# Patient Record
Sex: Male | Born: 1985 | Race: Black or African American | Hispanic: No | Marital: Single | Smoking: Current every day smoker
Health system: Southern US, Community
[De-identification: ages and names within clinical notes are randomized; demographics above are authoritative.]

## PROBLEM LIST (undated history)

## (undated) DIAGNOSIS — R109 Unspecified abdominal pain: Secondary | ICD-10-CM

## (undated) DIAGNOSIS — T560X1A Toxic effect of lead and its compounds, accidental (unintentional), initial encounter: Secondary | ICD-10-CM

## (undated) DIAGNOSIS — R079 Chest pain, unspecified: Secondary | ICD-10-CM

## (undated) DIAGNOSIS — I10 Essential (primary) hypertension: Secondary | ICD-10-CM

## (undated) HISTORY — PX: FOOT SURGERY: SHX648

## (undated) HISTORY — DX: Chest pain, unspecified: R07.9

---
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
## 2009-01-20 ENCOUNTER — Emergency Department (HOSPITAL_COMMUNITY): Admission: EM | Admit: 2009-01-20 | Discharge: 2009-01-20 | Payer: Self-pay | Admitting: Emergency Medicine

## 2010-02-21 ENCOUNTER — Emergency Department (HOSPITAL_COMMUNITY)
Admission: EM | Admit: 2010-02-21 | Discharge: 2010-02-21 | Disposition: A | Discharge status: Home / Self Care | Payer: Medicaid Other | Attending: Emergency Medicine | Admitting: Emergency Medicine

## 2010-02-21 DIAGNOSIS — Y929 Unspecified place or not applicable: Secondary | ICD-10-CM | POA: Insufficient documentation

## 2010-02-21 DIAGNOSIS — M79609 Pain in unspecified limb: Secondary | ICD-10-CM | POA: Insufficient documentation

## 2010-02-21 DIAGNOSIS — T8132XA Disruption of internal operation (surgical) wound, not elsewhere classified, initial encounter: Secondary | ICD-10-CM | POA: Insufficient documentation

## 2010-02-21 DIAGNOSIS — T81329A Deep disruption or dehiscence of operation wound, unspecified, initial encounter: Secondary | ICD-10-CM | POA: Insufficient documentation

## 2010-02-21 DIAGNOSIS — Y838 Other surgical procedures as the cause of abnormal reaction of the patient, or of later complication, without mention of misadventure at the time of the procedure: Secondary | ICD-10-CM | POA: Insufficient documentation

## 2010-04-01 LAB — URINE MICROSCOPIC-ADD ON

## 2010-04-01 LAB — URINALYSIS, ROUTINE W REFLEX MICROSCOPIC
Glucose, UA: NEGATIVE mg/dL
Nitrite: NEGATIVE
Specific Gravity, Urine: 1.024 (ref 1.005–1.030)
pH: 7 (ref 5.0–8.0)

## 2010-04-01 LAB — DIFFERENTIAL
Eosinophils Absolute: 0.3 10*3/uL (ref 0.0–0.7)
Lymphocytes Relative: 38 % (ref 12–46)
Lymphs Abs: 4.7 10*3/uL — ABNORMAL HIGH (ref 0.7–4.0)
Monocytes Relative: 6 % (ref 3–12)
Neutro Abs: 6.5 10*3/uL (ref 1.7–7.7)
Neutrophils Relative %: 53 % (ref 43–77)

## 2010-04-01 LAB — CBC
Hemoglobin: 14.3 g/dL (ref 13.0–17.0)
MCHC: 32.3 g/dL (ref 30.0–36.0)
MCV: 67.2 fL — ABNORMAL LOW (ref 78.0–100.0)
RDW: 16.5 % — ABNORMAL HIGH (ref 11.5–15.5)

## 2010-04-01 LAB — LIPASE, BLOOD: Lipase: 36 U/L (ref 11–59)

## 2010-04-01 LAB — COMPREHENSIVE METABOLIC PANEL
ALT: 34 U/L (ref 0–53)
Calcium: 9.2 mg/dL (ref 8.4–10.5)
Creatinine, Ser: 0.84 mg/dL (ref 0.4–1.5)
Glucose, Bld: 94 mg/dL (ref 70–99)
Sodium: 137 mEq/L (ref 135–145)
Total Protein: 6.2 g/dL (ref 6.0–8.3)

## 2010-06-01 NOTE — Consult Note (Signed)
NAMEAKSH, SWART                ACCOUNT NO.:  [id number]   MEDICAL RECORD NO.:  [id number]         PATIENT TYPE:  LEMS   LOCATION:                               FACILITY:  [facility]   PHYSICIAN:  Karol T. Lazarus Salines, M.D.      DATE OF BIRTH:   DATE OF CONSULTATION:  08/05/2005  DATE OF DISCHARGE:                                   CONSULTATION   CHIEF COMPLAINT:  Severe right sore throat.   HISTORY:  A 25 year old black male came down with a mild sore throat Monday  6 days ago which has gotten progressively worse; and over the past couple of  days has localized to the right side of his throat.  He complained about  right ear pain; and was told by the [facility] that he had an ear infection.  He was given ear drops which did nothing for the pain.  He has had a sore  throat with painful difficult swallowing.  He really has not had nothing  much to eat in 3 or 4 days and not much to drink in the past 24-48 hours.  He has tenderness in his right upper neck.  No documented fever.  He is not  diabetic or otherwise immune compromised.  No past history of tonsillitis,  strep throat, or peritonsillar abscess.  He smokes 1/2-pack-per-day.   PHYSICAL EXAMINATION:  GENERAL:  This is a healthy-appearing, but somewhat  apprehensive adolescent black male.  He has a mild hot potato voice and a  slight trismus.  He is not warm to touch.  MENTAL STATUS:  Basically intact.  FOCUSED EXAM:  He hears well in conversational speech.  Voice is phonatory  and respirations unlabored through the nose.  The head is atraumatic and  neck supple.  Cranial nerves intact.  The ears are clear with normal aerated  drums.  Anterior nose not examined.  Oral cavity is moist with teeth in good  repair.  He had erythematous, indurated, tender swelling of the superior  peritonsillar region and soft palate with deviation of the uvula towards the  left.  There was some apparent spontaneous drainage from the superior pole  of the  right tonsil.  There is some foul smelling odor consistent with  anaerobic infection.  The left tonsil is 2+ and not inflamed.  Did not  examine the nasopharynx or hypopharynx.  Neck is slightly tender in the  right jugulodigastric region without discrete adenopathy.   IMPRESSION:  Right peritonsillar abscess.   PLAN:  I discussed this with the patient and his mother.  He needs incision  and drainage either here or in the operating room.  We can do this with  conscious sedation technique in the emergency room at his election.   DESCRIPTION OF PROCEDURE:  With informed consent, having received 4 mg of IV  morphine within the last 20 minutes, he received 2 mg of IV Versed followed  by another 2 mg when the first dose did not have much of an effect.  The  soft palate was anesthetized with Hurricaine spray x2 followed  by roughly 10  mL of 1% Xylocaine with 1:200,000 epinephrine infiltrated with a 25-gauge  spinal needle in stages.  This did seem to be taking adequate effect.  After  allowing time for it take effect, with good compliance on the part of the  patient, a 2-cm crescent incision was made overlying the superior pole of  the right tonsil; and carried down sharply approximately 1.5 cm.  No abscess  cavity was encountered.  Slightly deeper to the superior pole a small  trickle of pus was identified.  The opening was enlarged with the tonsil  hemostat with mild discomfort.  A large foul-smelling anaerobic abscess,  approximately 10 mL total was evacuated from the posterior-superior pole of  the tonsil.  The patient overall tolerated this well.  Hemostasis was  spontaneous.   Will complete the recovery from the conscious sedation technique.  I gave  him a prescription for amoxicillin liquid, 1 gram t.i.d. for 3 days followed  by 500 mg capsules t.i.d. for 7 additional days.  Also a prescription for  Tylenol with Codeine liquid for pain relief.  I cautioned mother that the  abscess  cavity could possibly attempt to close up, in which case he might  not be making steady progress over the next 1-3 days.  If he is doing well  over that time, he will probably resolve completely; and we will see him  back in 2 weeks.  Absent any past history of significant tonsil problems, he  should not necessarily need a tonsillectomy.  Family understands and agrees  with the discussion and plans.      Gloris Manchester. Lazarus Salines, M.D.  Electronically Signed     KTW/MEDQ  D:  08/05/2005  T:  08/05/2005  Job:  [id number]

## 2011-10-02 ENCOUNTER — Encounter (HOSPITAL_COMMUNITY): Payer: Self-pay | Admitting: *Deleted

## 2011-10-02 ENCOUNTER — Emergency Department (HOSPITAL_COMMUNITY)
Admission: EM | Admit: 2011-10-02 | Discharge: 2011-10-02 | Disposition: A | Discharge status: Home / Self Care | Payer: Medicaid Other | Attending: Emergency Medicine | Admitting: Emergency Medicine

## 2011-10-02 DIAGNOSIS — R197 Diarrhea, unspecified: Secondary | ICD-10-CM | POA: Insufficient documentation

## 2011-10-02 DIAGNOSIS — S1093XA Contusion of unspecified part of neck, initial encounter: Secondary | ICD-10-CM | POA: Insufficient documentation

## 2011-10-02 DIAGNOSIS — B49 Unspecified mycosis: Secondary | ICD-10-CM | POA: Insufficient documentation

## 2011-10-02 DIAGNOSIS — Z23 Encounter for immunization: Secondary | ICD-10-CM | POA: Insufficient documentation

## 2011-10-02 DIAGNOSIS — Z79899 Other long term (current) drug therapy: Secondary | ICD-10-CM | POA: Insufficient documentation

## 2011-10-02 DIAGNOSIS — W503XXA Accidental bite by another person, initial encounter: Secondary | ICD-10-CM

## 2011-10-02 DIAGNOSIS — R109 Unspecified abdominal pain: Secondary | ICD-10-CM | POA: Insufficient documentation

## 2011-10-02 DIAGNOSIS — M545 Low back pain, unspecified: Secondary | ICD-10-CM | POA: Insufficient documentation

## 2011-10-02 DIAGNOSIS — L84 Corns and callosities: Secondary | ICD-10-CM | POA: Insufficient documentation

## 2011-10-02 DIAGNOSIS — R110 Nausea: Secondary | ICD-10-CM

## 2011-10-02 DIAGNOSIS — R112 Nausea with vomiting, unspecified: Secondary | ICD-10-CM | POA: Insufficient documentation

## 2011-10-02 DIAGNOSIS — S0003XA Contusion of scalp, initial encounter: Secondary | ICD-10-CM | POA: Insufficient documentation

## 2011-10-02 LAB — CBC WITH DIFFERENTIAL/PLATELET
Basophils Absolute: 0.1 10*3/uL (ref 0.0–0.1)
Eosinophils Absolute: 0.2 10*3/uL (ref 0.0–0.7)
Lymphocytes Relative: 39 % (ref 12–46)
Lymphs Abs: 3.4 10*3/uL (ref 0.7–4.0)
MCHC: 32.6 g/dL (ref 30.0–36.0)
Monocytes Relative: 4 % (ref 3–12)
Neutro Abs: 4.6 10*3/uL (ref 1.7–7.7)
Platelets: 259 10*3/uL (ref 150–400)
RDW: 16.5 % — ABNORMAL HIGH (ref 11.5–15.5)
WBC: 8.6 10*3/uL (ref 4.0–10.5)

## 2011-10-02 LAB — URINALYSIS, ROUTINE W REFLEX MICROSCOPIC
Glucose, UA: NEGATIVE mg/dL
Hgb urine dipstick: NEGATIVE
Leukocytes, UA: NEGATIVE
pH: 6.5 (ref 5.0–8.0)

## 2011-10-02 LAB — COMPREHENSIVE METABOLIC PANEL
AST: 23 U/L (ref 0–37)
Albumin: 4 g/dL (ref 3.5–5.2)
BUN: 11 mg/dL (ref 6–23)
Chloride: 103 mEq/L (ref 96–112)
Creatinine, Ser: 0.93 mg/dL (ref 0.50–1.35)
Potassium: 4.4 mEq/L (ref 3.5–5.1)
Total Bilirubin: 0.2 mg/dL — ABNORMAL LOW (ref 0.3–1.2)
Total Protein: 6.9 g/dL (ref 6.0–8.3)

## 2011-10-02 MED ORDER — DIPHENOXYLATE-ATROPINE 2.5-0.025 MG PO TABS: 1.0000 | ORAL_TABLET | Freq: Four times a day (QID) | ORAL | Status: DC | PRN

## 2011-10-02 MED ORDER — ONDANSETRON HCL 4 MG PO TABS: 4.0000 mg | ORAL_TABLET | Freq: Four times a day (QID) | ORAL | Status: DC

## 2011-10-02 MED ORDER — ONDANSETRON HCL 4 MG/2ML IJ SOLN
4.0000 mg | Freq: Once | INTRAMUSCULAR | Status: AC
  Administered 2011-10-02: 4 mg via INTRAVENOUS
  Filled 2011-10-02: qty 2

## 2011-10-02 MED ORDER — TETANUS-DIPHTH-ACELL PERTUSSIS 5-2.5-18.5 LF-MCG/0.5 IM SUSP
0.5000 mL | Freq: Once | INTRAMUSCULAR | Status: AC
  Administered 2011-10-02: 0.5 mL via INTRAMUSCULAR
  Filled 2011-10-02: qty 0.5

## 2011-10-02 MED ORDER — SODIUM CHLORIDE 0.9 % IV BOLUS (SEPSIS)
1000.0000 mL | Freq: Once | INTRAVENOUS | Status: AC
  Administered 2011-10-02: 1000 mL via INTRAVENOUS

## 2011-10-02 MED ORDER — AMOXICILLIN-POT CLAVULANATE 500-125 MG PO TABS: 1.0000 | ORAL_TABLET | Freq: Two times a day (BID) | ORAL | Status: DC
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
## 2011-10-03 NOTE — ED Provider Notes (Signed)
Medical screening examination/treatment/procedure(s) were performed by non-physician practitioner and as supervising physician I was immediately available for consultation/collaboration.   Anaysha Andre, MD 10/03/11 0722 

## 2011-10-04 LAB — URINE CULTURE: Colony Count: 3000

## 2012-07-31 ENCOUNTER — Other Ambulatory Visit (HOSPITAL_COMMUNITY): Payer: Self-pay | Admitting: Podiatry

## 2012-08-03 ENCOUNTER — Other Ambulatory Visit (HOSPITAL_COMMUNITY): Payer: Self-pay | Admitting: Podiatry

## 2012-08-03 DIAGNOSIS — M79671 Pain in right foot: Secondary | ICD-10-CM

## 2012-08-07 ENCOUNTER — Encounter (HOSPITAL_COMMUNITY): Payer: Medicaid Other

## 2012-08-10 ENCOUNTER — Other Ambulatory Visit (HOSPITAL_COMMUNITY): Payer: Medicaid Other

## 2012-08-10 ENCOUNTER — Encounter (HOSPITAL_COMMUNITY): Payer: Medicaid Other

## 2012-08-13 ENCOUNTER — Ambulatory Visit (HOSPITAL_COMMUNITY): Admission: RE | Admit: 2012-08-13 | Payer: Medicaid Other | Source: Ambulatory Visit

## 2012-08-13 ENCOUNTER — Ambulatory Visit (HOSPITAL_COMMUNITY): Payer: Medicaid Other

## 2012-09-02 ENCOUNTER — Ambulatory Visit (HOSPITAL_COMMUNITY): Payer: Medicaid Other | Attending: Podiatry

## 2012-09-02 ENCOUNTER — Encounter (HOSPITAL_COMMUNITY): Admission: RE | Admit: 2012-09-02 | Payer: Medicaid Other | Source: Ambulatory Visit

## 2013-11-09 ENCOUNTER — Encounter (HOSPITAL_COMMUNITY): Payer: Self-pay | Admitting: Emergency Medicine

## 2013-11-09 ENCOUNTER — Emergency Department (HOSPITAL_COMMUNITY): Payer: Medicaid Other

## 2013-11-09 ENCOUNTER — Emergency Department (HOSPITAL_COMMUNITY)
Admission: EM | Admit: 2013-11-09 | Discharge: 2013-11-09 | Disposition: A | Discharge status: Home / Self Care | Payer: Medicaid Other | Attending: Emergency Medicine | Admitting: Emergency Medicine

## 2013-11-09 DIAGNOSIS — R1032 Left lower quadrant pain: Secondary | ICD-10-CM | POA: Insufficient documentation

## 2013-11-09 DIAGNOSIS — K5900 Constipation, unspecified: Secondary | ICD-10-CM | POA: Diagnosis not present

## 2013-11-09 DIAGNOSIS — R1033 Periumbilical pain: Secondary | ICD-10-CM | POA: Insufficient documentation

## 2013-11-09 DIAGNOSIS — Z720 Tobacco use: Secondary | ICD-10-CM | POA: Diagnosis not present

## 2013-11-09 DIAGNOSIS — R109 Unspecified abdominal pain: Secondary | ICD-10-CM

## 2013-11-09 LAB — COMPREHENSIVE METABOLIC PANEL
ALK PHOS: 68 U/L (ref 39–117)
ALT: 18 U/L (ref 0–53)
AST: 22 U/L (ref 0–37)
Albumin: 4.2 g/dL (ref 3.5–5.2)
Anion gap: 11 (ref 5–15)
BUN: 12 mg/dL (ref 6–23)
CO2: 27 meq/L (ref 19–32)
Calcium: 9.2 mg/dL (ref 8.4–10.5)
Chloride: 103 mEq/L (ref 96–112)
Creatinine, Ser: 0.98 mg/dL (ref 0.50–1.35)
GLUCOSE: 86 mg/dL (ref 70–99)
POTASSIUM: 4.1 meq/L (ref 3.7–5.3)
SODIUM: 141 meq/L (ref 137–147)
TOTAL PROTEIN: 7.4 g/dL (ref 6.0–8.3)
Total Bilirubin: 0.4 mg/dL (ref 0.3–1.2)

## 2013-11-09 LAB — CBC WITH DIFFERENTIAL/PLATELET
BASOS PCT: 0 % (ref 0–1)
Basophils Absolute: 0 10*3/uL (ref 0.0–0.1)
EOS PCT: 1 % (ref 0–5)
Eosinophils Absolute: 0.1 10*3/uL (ref 0.0–0.7)
HCT: 40.4 % (ref 39.0–52.0)
HEMOGLOBIN: 13.3 g/dL (ref 13.0–17.0)
LYMPHS PCT: 31 % (ref 12–46)
Lymphs Abs: 3.1 10*3/uL (ref 0.7–4.0)
MCH: 21.8 pg — ABNORMAL LOW (ref 26.0–34.0)
MCHC: 32.9 g/dL (ref 30.0–36.0)
MCV: 66.3 fL — ABNORMAL LOW (ref 78.0–100.0)
Monocytes Absolute: 0.5 10*3/uL (ref 0.1–1.0)
Monocytes Relative: 5 % (ref 3–12)
NEUTROS PCT: 63 % (ref 43–77)
Neutro Abs: 6.2 10*3/uL (ref 1.7–7.7)
Platelets: 312 10*3/uL (ref 150–400)
RBC: 6.09 MIL/uL — AB (ref 4.22–5.81)
RDW: 15.5 % (ref 11.5–15.5)
WBC: 9.9 10*3/uL (ref 4.0–10.5)

## 2013-11-09 LAB — URINALYSIS, ROUTINE W REFLEX MICROSCOPIC
Bilirubin Urine: NEGATIVE
GLUCOSE, UA: NEGATIVE mg/dL
HGB URINE DIPSTICK: NEGATIVE
Ketones, ur: NEGATIVE mg/dL
LEUKOCYTES UA: NEGATIVE
Nitrite: NEGATIVE
PH: 6.5 (ref 5.0–8.0)
PROTEIN: NEGATIVE mg/dL
Specific Gravity, Urine: 1.019 (ref 1.005–1.030)
Urobilinogen, UA: 0.2 mg/dL (ref 0.0–1.0)

## 2013-11-09 LAB — LIPASE, BLOOD: Lipase: 24 U/L (ref 11–59)

## 2013-11-09 MED ORDER — IOHEXOL 300 MG/ML  SOLN
25.0000 mL | Freq: Once | INTRAMUSCULAR | Status: AC | PRN
  Administered 2013-11-09: 25 mL via ORAL

## 2013-11-09 MED ORDER — NAPROXEN 500 MG PO TABS: 500.0000 mg | ORAL_TABLET | Freq: Two times a day (BID) | ORAL | Status: DC | PRN

## 2013-11-09 MED ORDER — IOHEXOL 300 MG/ML  SOLN
100.0000 mL | Freq: Once | INTRAMUSCULAR | Status: AC | PRN
  Administered 2013-11-09: 100 mL via INTRAVENOUS

## 2013-11-09 MED ORDER — HYDROCODONE-ACETAMINOPHEN 5-325 MG PO TABS: 1.0000 | ORAL_TABLET | Freq: Four times a day (QID) | ORAL | Status: DC | PRN

## 2013-11-09 MED ORDER — SODIUM CHLORIDE 0.9 % IV BOLUS (SEPSIS)
1000.0000 mL | Freq: Once | INTRAVENOUS | Status: AC
  Administered 2013-11-09: 1000 mL via INTRAVENOUS

## 2013-11-09 MED ORDER — MORPHINE SULFATE 4 MG/ML IJ SOLN
4.0000 mg | Freq: Once | INTRAMUSCULAR | Status: AC
  Administered 2013-11-09: 4 mg via INTRAVENOUS
  Filled 2013-11-09: qty 1

## 2013-11-09 MED ORDER — POLYETHYLENE GLYCOL 3350 17 G PO PACK: 17.0000 g | PACK | Freq: Every day | ORAL | Status: DC

## 2013-11-09 MED ORDER — MAGNESIUM CITRATE PO SOLN: 0.5000 | Freq: Once | ORAL | Status: DC

## 2013-11-09 NOTE — ED Notes (Signed)
Pt reports generalized abd pain x 5 days. Pt reports not eating but denies vomiting, diarrhea, or urinary symptoms.

## 2013-11-09 NOTE — ED Provider Notes (Signed)
CSN: [id number]     Arrival date & time 11/09/13  1515 History   First MD Initiated Contact with Patient 11/09/13 1919     Chief Complaint  Patient presents with  . Abdominal Pain     (Consider location/radiation/quality/duration/timing/severity/associated sxs/prior Treatment) HPI Comments: Andre Padilla is a 28 y.o. male with no significant PMHx, who presents to the ED with complaints of gradual onset L lateral abd pain x3 days. Pt states the pain is 10/10, constant, "twisting" pain, radiating into his periumbilical region and occasionally becomes generalized, worse with eating and walking, improved mildly with aleve and rolaids, and associated with decreased appetite. Reports that his last BM was Sunday, and was hard but did not have any abnormal colorations or textures. Last flatus was yesterday night. Denies fevers, chills, URI symptoms, CP, SOB, cough, n/v/d, obstipation, belching, dysuria, hematuria, malodorous urine, hematochezia, melena, rectal pain, myalgias, arthralgias, paresthesias, weakness, back pain, flank pain, penile pain/discharge, testicular pain, sick contacts, recent travel, suspicious food intake, trauma or injury, EtOH or illicit drug use, or prior abd surgeries. Denies ever having anything like this before. Tolerating water, but has not been eating much since onset of illness.  Patient is a 28 y.o. male presenting with abdominal pain. The history is provided by the patient. No language interpreter was used.  Abdominal Pain Pain location:  LLQ Pain quality comment:  Twisting Pain radiates to:  Periumbilical region Pain severity:  Severe (10/10) Onset quality:  Gradual Duration:  3 days Timing:  Constant Progression:  Unchanged Chronicity:  New Context: eating   Context: not alcohol use, not diet changes, not recent illness, not recent travel, not sick contacts, not suspicious food intake and not trauma   Relieved by:  NSAIDs and antacids Worsened by:  Eating and  movement Ineffective treatments:  None tried Associated symptoms: anorexia and constipation   Associated symptoms: no belching, no chest pain, no chills, no cough, no diarrhea, no dysuria, no fever, no flatus, no hematemesis, no hematochezia, no hematuria, no melena, no nausea, no shortness of breath, no sore throat and no vomiting   Risk factors: obesity   Risk factors: no alcohol abuse and has not had multiple surgeries     History reviewed. No pertinent past medical history. History reviewed. No pertinent past surgical history. History reviewed. No pertinent family history. History  Substance Use Topics  . Smoking status: Current Every Day Smoker    Types: Cigarettes  . Smokeless tobacco: Not on file  . Alcohol Use: No    Review of Systems  Constitutional: Negative for fever, chills and diaphoresis.  HENT: Negative for congestion, rhinorrhea and sore throat.   Respiratory: Negative for cough and shortness of breath.   Cardiovascular: Negative for chest pain.  Gastrointestinal: Positive for abdominal pain, constipation and anorexia. Negative for nausea, vomiting, diarrhea, blood in stool, melena, hematochezia, abdominal distention, rectal pain, flatus and hematemesis.  Genitourinary: Negative for dysuria, urgency, frequency, hematuria, flank pain, discharge, penile pain and testicular pain.  Musculoskeletal: Negative for arthralgias, back pain and myalgias.  Skin: Negative for rash.  Neurological: Negative for dizziness, syncope, weakness, light-headedness and headaches.  Psychiatric/Behavioral: Negative for confusion.   10 Systems reviewed and are negative for acute change except as noted in the HPI.    Allergies  Review of patient's allergies indicates no known allergies.  Home Medications   Prior to Admission medications   Not on File   BP 126/64  Pulse 58  Temp(Src) 98.5 F (36.9 C) (Oral)  Resp 18  SpO2 98% Physical Exam  Nursing note and vitals  reviewed. Constitutional: He is oriented to person, place, and time. Vital signs are normal. He appears well-developed and well-nourished.  Non-toxic appearance. No distress.  Afebrile, nontoxic although appears to feel uncomfortable, VSS  HENT:  Head: Normocephalic and atraumatic.  Mouth/Throat: Oropharynx is clear and moist. Mucous membranes are dry (mildly).  Mildly dry mucous membranes  Eyes: Conjunctivae and EOM are normal. Right eye exhibits no discharge. Left eye exhibits no discharge.  Neck: Normal range of motion. Neck supple.  Cardiovascular: Normal rate, regular rhythm, normal heart sounds and intact distal pulses.  Exam reveals no gallop and no friction rub.   No murmur heard. Pulmonary/Chest: Effort normal and breath sounds normal. No respiratory distress. He has no decreased breath sounds. He has no wheezes. He has no rhonchi. He has no rales.  Abdominal: Soft. Normal appearance and bowel sounds are normal. He exhibits no distension. There is tenderness in the periumbilical area and left lower quadrant. There is no rigidity, no rebound, no guarding, no CVA tenderness, no tenderness at McBurney's point and negative Murphy's sign.    Soft, nondistended, +BS throughout, mildly tender in L lateral abdomen in lower and upper quadrants as well as periumbilical region, no r/g/r, neg murphy's, neg mcburney's, no CVA TTP   Musculoskeletal: Normal range of motion.  Neurological: He is alert and oriented to person, place, and time.  Skin: Skin is warm, dry and intact. No rash noted.  Psychiatric: He has a normal mood and affect.    ED Course  Procedures (including critical care time) Labs Review Labs Reviewed  CBC WITH DIFFERENTIAL - Abnormal; Notable for the following:    RBC 6.09 (*)    MCV 66.3 (*)    MCH 21.8 (*)    All other components within normal limits  COMPREHENSIVE METABOLIC PANEL  LIPASE, BLOOD  URINALYSIS, ROUTINE W REFLEX MICROSCOPIC    Imaging Review Ct Abdomen  Pelvis W Contrast  11/09/2013   CLINICAL DATA:  Generalized abdominal pain for 5 days.  EXAM: CT ABDOMEN AND PELVIS WITH CONTRAST  TECHNIQUE: Multidetector CT imaging of the abdomen and pelvis was performed using the standard protocol following bolus administration of intravenous contrast.  CONTRAST:  185mL OMNIPAQUE IOHEXOL 300 MG/ML  SOLN  COMPARISON:  None.  FINDINGS: Lower chest: The lung bases are clear. No pleural or pericardial effusion.  Hepatobiliary: No focal liver abnormality. The gallbladder appears normal. No biliary dilatation.  Pancreas: The pancreas is on unremarkable.  Spleen: The spleen appears normal.  Adrenals/Urinary Tract: Normal appearance of the adrenal glands. There is a mild, subtle striated nephrographic appearance of the upper poles of the left kidney. No hydronephrosis. The urinary bladder appears normal.  Stomach/Bowel: The stomach appears normal. The small bowel loops have a normal course and caliber. The cecum is situated within the right upper quadrant of the abdomen. The ileocecal valve and appendix are difficult to identify separate from the proximal colon and distal small bowel loops. No inflammation identified within this area. The mid and distal colon are on unremarkable.  Vascular/Lymphatic: Normal caliber of the abdominal aorta. There is no retroperitoneal adenopathy. No mesenteric adenopathy.  Reproductive: The prostate gland and seminal vesicles are unremarkable.  Other: The visualized osseous structures are on unremarkable.  Musculoskeletal: Mild scoliosis deformity involves the thoracic and lumbar spine.  IMPRESSION: 1. Mild striated nephrographic appearance of the upper pole of left kidney. This is a nonspecific finding but may be  found with pyelonephritis. Careful clinical correlation advised. 2. Nonvisualization of the appendix. No secondary signs of acute appendicitis.   Electronically Signed   By: Kerby Moors M.D.   On: 11/09/2013 21:48     EKG  Interpretation None      MDM   Final diagnoses:  Abdominal pain  Constipation, unspecified constipation type    28y/o male with 3 days of abd pain, reports it's mostly LLQ but also states it's generalized. Afebrile, nontoxic but does appear slightly uncomfortable. No BM in 3 days, still passing flatus, low clinical suspicion for obstruction but this is on the differential. Abd exam with moderate tenderness mostly in LUQ/LLQ and periumbilical regions. Could be diverticulitis although CBC w/diff WNL aside from atypical lymphocytes seen (possibly viral illness?), and CMP WNL, lipase WNL. U/A pending but doubt UTI or pyelo/nephrolithiasis given lack of flank pain or colicky pain. Given that pt is fairly tender and no definitive historical clue for etiology, will proceed with CT abd/pelvis to evaluate for obstruction, diverticulosis, colitis, or other etiology for pain. Will give morphine and IVFs and reassess shortly.  10:32 PM U/A WNL. CT showing mild striated nephrographic of upper pole of L kidney, but clinical picture for pyelo does not fit given U/A WNL, afebrile, and no leukocytosis. CT does show moderate stool burden in L colon, no obstruction. Pain improved with morphine and fluids. Pt tolerating PO. Will treat this as constipation but discussed that he should f/up with urology for the changes seen on CT. Doubt pyelo given U/A neg and no WBC. Rx for miralax and mag citrate, and pain control given. Discussed fluids and fiber to help with constipation. I explained the diagnosis and have given explicit precautions to return to the ER including for any other new or worsening symptoms. The patient understands and accepts the medical plan as it's been dictated and I have answered their questions. Discharge instructions concerning home care and prescriptions have been given. The patient is STABLE and is discharged to home in good condition.  BP 105/59  Pulse 58  Temp(Src) 98.3 F (36.8 C) (Oral)   Resp 18  SpO2 100%  Meds ordered this encounter  Medications  . sodium chloride 0.9 % bolus 1,000 mL    Sig:   . morphine 4 MG/ML injection 4 mg    Sig:   . iohexol (OMNIPAQUE) 300 MG/ML solution 25 mL    Sig:   . iohexol (OMNIPAQUE) 300 MG/ML solution 100 mL    Sig:   . HYDROcodone-acetaminophen (NORCO) 5-325 MG per tablet    Sig: Take 1-2 tablets by mouth every 6 (six) hours as needed for severe pain.    Dispense:  12 tablet    Refill:  0    Order Specific Question:  Supervising Provider    Answer:  Noemi Chapel D [[id number]]  . naproxen (NAPROSYN) 500 MG tablet    Sig: Take 1 tablet (500 mg total) by mouth 2 (two) times daily as needed for mild pain or moderate pain (TAKE WITH MEALS.).    Dispense:  20 tablet    Refill:  0    Order Specific Question:  Supervising Provider    Answer:  Noemi Chapel D [[id number]]  . polyethylene glycol (MIRALAX / GLYCOLAX) packet    Sig: Take 17 g by mouth daily.    Dispense:  14 each    Refill:  0    Order Specific Question:  Supervising Provider    Answer:  Noemi Chapel D [[id number]]  .  magnesium citrate SOLN    Sig: Take 148 mLs (0.5 Bottles total) by mouth once. If you don't have a bowel movement after 2 hours, use the other 0.5 bottle.    Dispense:  1 Bottle    Refill:  0    Order Specific Question:  Supervising Provider    Answer:  Johnna Acosta [id number] [place], PA-C 11/09/13 2257

## 2013-11-09 NOTE — Discharge Instructions (Signed)
Your abdominal pain seems to be related to constipation. Increased your fiber and water intake to help with this, and use miralax as directed to help soften your stool. If in 2 days you haven't had a bowel movement, use mag citrate as directed. Use naprosyn and norco as directed as needed for pain, but don't drive or operate heavy machinery while taking norco and keep in mind that norco may worsen your constipation. Your CT showed some mild changes in your kidney and I would like you to see the urologist to have them follow up with these changes. Return to the ER for any changes or worsening symptoms.   Abdominal Pain Many things can cause belly (abdominal) pain. Most times, the belly pain is not dangerous. Many cases of belly pain can be watched and treated at home. HOME CARE   Do not take medicines that help you go poop (laxatives) unless told to by your doctor.  Only take medicine as told by your doctor.  Eat or drink as told by your doctor. Your doctor will tell you if you should be on a special diet. GET HELP IF:  You do not know what is causing your belly pain.  You have belly pain while you are sick to your stomach (nauseous) or have runny poop (diarrhea).  You have pain while you pee or poop.  Your belly pain wakes you up at night.  You have belly pain that gets worse or better when you eat.  You have belly pain that gets worse when you eat fatty foods.  You have a fever. GET HELP RIGHT AWAY IF:   The pain does not go away within 2 hours.  You keep throwing up (vomiting).  The pain changes and is only in the right or left part of the belly.  You have bloody or tarry looking poop. MAKE SURE YOU:   Understand these instructions.  Will watch your condition.  Will get help right away if you are not doing well or get worse. Document Released: 06/19/2007 Document Revised: 01/05/2013 Document Reviewed: 09/09/2012 [facility] Patient Information 2015 [place], [place]. This  information is not intended to replace advice given to you by your health care provider. Make sure you discuss any questions you have with your health care provider.  Constipation Constipation is when a person:  Poops (has a bowel movement) less than 3 times a week.  Has a hard time pooping.  Has poop that is dry, hard, or bigger than normal. HOME CARE   Eat foods with a lot of fiber in them. This includes fruits, vegetables, beans, and whole grains such as brown rice.  Avoid fatty foods and foods with a lot of sugar. This includes french fries, hamburgers, cookies, candy, and soda.  If you are not getting enough fiber from food, take products with added fiber in them (supplements).  Drink enough fluid to keep your pee (urine) clear or pale yellow.  Exercise on a regular basis, or as told by your doctor.  Go to the restroom when you feel like you need to poop. Do not hold it.  Only take medicine as told by your doctor. Do not take medicines that help you poop (laxatives) without talking to your doctor first. GET HELP RIGHT AWAY IF:   You have bright red blood in your poop (stool).  Your constipation lasts more than 4 days or gets worse.  You have belly (abdominal) or butt (rectal) pain.  You have thin poop (as thin  as a pencil).  You lose weight, and it cannot be explained. MAKE SURE YOU:   Understand these instructions.  Will watch your condition.  Will get help right away if you are not doing well or get worse. Document Released: 06/19/2007 Document Revised: 01/05/2013 Document Reviewed: 10/12/2012 [facility] Patient Information 2015 [place], [place]. This information is not intended to replace advice given to you by your health care provider. Make sure you discuss any questions you have with your health care provider.

## 2013-11-10 NOTE — ED Provider Notes (Signed)
Medical screening examination/treatment/procedure(s) were performed by non-physician practitioner and as supervising physician I was immediately available for consultation/collaboration.   EKG Interpretation None        Orpah Greek, MD 11/10/13 Laureen Abrahams

## 2014-08-18 ENCOUNTER — Emergency Department (HOSPITAL_COMMUNITY)
Admission: EM | Admit: 2014-08-18 | Discharge: 2014-08-18 | Disposition: A | Discharge status: Home / Self Care | Payer: Medicaid Other | Attending: Emergency Medicine | Admitting: Emergency Medicine

## 2014-08-18 ENCOUNTER — Encounter (HOSPITAL_COMMUNITY): Payer: Self-pay | Admitting: *Deleted

## 2014-08-18 DIAGNOSIS — Z720 Tobacco use: Secondary | ICD-10-CM | POA: Insufficient documentation

## 2014-08-18 DIAGNOSIS — L02611 Cutaneous abscess of right foot: Secondary | ICD-10-CM | POA: Diagnosis not present

## 2014-08-18 DIAGNOSIS — B070 Plantar wart: Secondary | ICD-10-CM | POA: Diagnosis not present

## 2014-08-18 DIAGNOSIS — Z79899 Other long term (current) drug therapy: Secondary | ICD-10-CM | POA: Insufficient documentation

## 2014-08-18 DIAGNOSIS — M79671 Pain in right foot: Secondary | ICD-10-CM | POA: Diagnosis present

## 2014-08-18 LAB — CBC
HCT: 43 % (ref 39.0–52.0)
Hemoglobin: 14.5 g/dL (ref 13.0–17.0)
MCH: 22.2 pg — ABNORMAL LOW (ref 26.0–34.0)
MCHC: 33.7 g/dL (ref 30.0–36.0)
MCV: 65.7 fL — ABNORMAL LOW (ref 78.0–100.0)
Platelets: 291 10*3/uL (ref 150–400)
RBC: 6.54 MIL/uL — ABNORMAL HIGH (ref 4.22–5.81)
RDW: 16 % — ABNORMAL HIGH (ref 11.5–15.5)
WBC: 11.4 10*3/uL — ABNORMAL HIGH (ref 4.0–10.5)

## 2014-08-18 LAB — SEDIMENTATION RATE: Sed Rate: 0 mm/hr (ref 0–16)

## 2014-08-18 LAB — BASIC METABOLIC PANEL
Anion gap: 9 (ref 5–15)
BUN: 9 mg/dL (ref 6–20)
CO2: 27 mmol/L (ref 22–32)
CREATININE: 0.98 mg/dL (ref 0.61–1.24)
Calcium: 9.5 mg/dL (ref 8.9–10.3)
Chloride: 105 mmol/L (ref 101–111)
GFR calc non Af Amer: >60 mL/min (ref >60)
Glucose, Bld: 72 mg/dL (ref 65–99)
Potassium: 4 mmol/L (ref 3.5–5.1)
SODIUM: 141 mmol/L (ref 135–145)

## 2014-08-18 LAB — CBG MONITORING, ED: Glucose-Capillary: 72 mg/dL (ref 65–99)

## 2014-08-18 MED ORDER — HYDROCODONE-ACETAMINOPHEN 5-325 MG PO TABS
1.0000 | ORAL_TABLET | Freq: Once | ORAL | Status: AC
Start: 2014-08-18 — End: 2014-08-18
  Administered 2014-08-18: 1 via ORAL
  Filled 2014-08-18: qty 1

## 2014-08-18 MED ORDER — LIDOCAINE-EPINEPHRINE (PF) 2 %-1:200000 IJ SOLN
20.0000 mL | Freq: Once | INTRAMUSCULAR | Status: DC
  Filled 2014-08-18: qty 20

## 2014-08-18 MED ORDER — SALICYLIC ACID 17 % EX GEL: CUTANEOUS | Status: DC

## 2014-08-18 NOTE — ED Notes (Signed)
Pt c/o blister to R foot x 1 week.

## 2014-08-18 NOTE — ED Provider Notes (Signed)
CSN: [id number]     Arrival date & time 08/18/14  1558 History  This chart was scribed for Andre Grana, PA-C, working with Sharlett Iles, MD by Steva Colder, ED Scribe. The patient was seen in room TR05C/TR05C at 4:27 PM.    Chief Complaint  Patient presents with  . Foot Pain      The history is provided by the patient. No language interpreter was used.    HPI Comments: Andre Padilla is a 29 y.o. male who presents to the Emergency Department complaining of right foot pain onset 1 week. Pt reports that he has had a blister to the heel of his right foot and his right great toe is giving him pain. Pt notes that he does not have any other place that is affecting him. Pt has had bunionectomies in the past. Pt is able to ambulate but not without pain. Pt has not tried to open of the blisters, warm soaks, and a foot cream with no PO medications. Pt notes that those treatments gave him mild relief for his symptoms. Pt denies this happening before to his feet. Pt rates his right heel pain as 8/10 and that is more painful than his right great toe pain. He states that he is having associated symptoms of diaphoresis. 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  Family member present states that his callouses on his feet and toes have been there for several months.  He denies color change, rash, joint swelling, left foot pain, numbness, tingling, fever, chills, vomiting, and any other symptoms. Pt denies hx diabetes or peripheral neuropathy.   History reviewed. No pertinent past medical history. Past Surgical History  Procedure Laterality Date  . Foot surgery     No family history on file. History  Substance Use Topics  . Smoking status: Current Every Day Smoker -- 0.15 packs/day    Types: Cigarettes  . Smokeless tobacco: Not on file  . Alcohol Use: No    Review of Systems  Constitutional: Positive for diaphoresis.  Negative for fever and chills.  Musculoskeletal: Positive for arthralgias. Negative for joint swelling and gait problem.  Skin: Negative for color change and rash.       Blister to heel of right foot with multiple calluses to right foot.  Neurological: Negative for numbness.       No tingling      Allergies  Review of patient's allergies indicates no known allergies.  Home Medications   Prior to Admission medications   Medication Sig Start Date End Date Taking? Authorizing Provider  HYDROcodone-acetaminophen (NORCO) 5-325 MG per tablet Take 1-2 tablets by mouth every 6 (six) hours as needed for severe pain. 11/09/13   Mercedes Camprubi-Soms, PA-C  magnesium citrate SOLN Take 148 mLs (0.5 Bottles total) by mouth once. If you don't have a bowel movement after 2 hours, use the other 0.5 bottle. 11/09/13   Mercedes Camprubi-Soms, PA-C  naproxen (NAPROSYN) 500 MG tablet Take 1 tablet (500 mg total) by mouth 2 (two) times daily as needed for mild pain or moderate pain (TAKE WITH MEALS.). 11/09/13   Mercedes Camprubi-Soms, PA-C  polyethylene glycol (MIRALAX / GLYCOLAX) packet Take 17 g by mouth daily. 11/09/13   Mercedes Camprubi-Soms, PA-C   BP 135/76 mmHg  Pulse 99  Temp(Src) 99 F (37.2 C) (Oral)  Resp 18  Ht 5\' 11"  (1.803 m)  Wt 205 lb (92.987 kg)  BMI 28.60 kg/m2  SpO2 100% Physical Exam  Constitutional: He is oriented to person, place, and time. He appears well-developed and well-nourished. No distress.  HENT:  Head: Normocephalic and atraumatic.  Right Ear: External ear normal.  Left Ear: External ear normal.  Nose: Nose normal.  Mouth/Throat: Oropharynx is clear and moist. No oropharyngeal exudate.  Eyes: Conjunctivae and EOM are normal. Pupils are equal, round, and reactive to light. Right eye exhibits no discharge. Left eye exhibits no discharge. No scleral icterus.  Neck: Normal range of motion. Neck supple. No JVD present. No tracheal deviation present.  Cardiovascular:  Normal rate and regular rhythm.   Pulmonary/Chest: Effort normal and breath sounds normal. No stridor. No respiratory distress.  Musculoskeletal: Normal range of motion. He exhibits no edema.  Lymphadenopathy:    He has no cervical adenopathy.  Neurological: He is alert and oriented to person, place, and time. He exhibits normal muscle tone. Coordination normal.  Skin: Skin is warm and dry. No rash noted. He is not diaphoretic. No erythema. No pallor.  Blister to the plantar aspect of the right foot. Roughly 2 x 4 cm with surrounding erythema, no edema, ttp.  Diffuse thickening of skin and nails of bilateral feet and toes.  Great toe, bilaterally, thick callous continuous along the plantar side, ttp on left toe.  Psychiatric: He has a normal mood and affect. His behavior is normal. Judgment and thought content normal.  Nursing note and vitals reviewed.   ED Course  Procedures (including critical care time) DIAGNOSTIC STUDIES: Oxygen Saturation is 100% on RA, nl by my interpretation.    COORDINATION OF CARE: 4:33 PM-Discussed treatment plan which includes norco and labs with pt at bedside and pt agreed to plan.   Labs Review Labs Reviewed  CBC - Abnormal; Notable for the following:    WBC 11.4 (*)    RBC 6.54 (*)    MCV 65.7 (*)    MCH 22.2 (*)    RDW 16.0 (*)    All other components within normal limits  BASIC METABOLIC PANEL  SEDIMENTATION RATE  CBG MONITORING, ED   INCISION AND DRAINAGE Performed by: Andre Padilla Consent: Verbal consent obtained. Risks and benefits: risks, benefits and alternatives were discussed Time out performed prior to procedure Type: abscess Body area: right plantar foot Anesthesia: none Incision was made with a scalpel. Local anesthetic: n/a Anesthetic total: n/a Complexity: simple, debridement of superficial skin/callous Drainage: serous Drainage amount: moderate Packing material: none Patient tolerance: Patient tolerated the procedure well  with no immediate complications.   Imaging Review No results found.   EKG Interpretation None      MDM   Final diagnoses:  None    Pt with blister on bottom of right foot and painful callous on left foot great toe Blister opened skin trimmed.  Underlying skin pink, intact, without signs of infection.  Left great toe, attempted to reduce the size of callous that was causing pt discomfort.  Pt will need to soak, moistourize and exfoliate/debride his own feet.  There are no concerning signs of infection.  Pt is neurovascularly intact.  The majority of the skin and nails on both is feet are hypertrophic, which is chronic, and there is no indication for further intervention or procedure in the ER.  F/up was suggested at pt's podiatrist office, where he has already been established for care.  Opened blister was dressed with antibiotic ointment and gauze with wound care instructions and return precautions given.  Pt  was d/c home with normal vitals.  I personally performed the services described in this documentation, which was scribed in my presence. The recorded information has been reviewed and is accurate.    Andre Grana, PA-C 08/26/14 [place], MD 08/28/14 [phone]

## 2014-08-26 ENCOUNTER — Ambulatory Visit: Payer: Medicaid Other | Admitting: Podiatry

## 2014-10-17 ENCOUNTER — Emergency Department (HOSPITAL_COMMUNITY)
Admission: EM | Admit: 2014-10-17 | Discharge: 2014-10-17 | Disposition: A | Discharge status: Home / Self Care | Payer: Medicaid Other | Attending: Emergency Medicine | Admitting: Emergency Medicine

## 2014-10-17 ENCOUNTER — Encounter (HOSPITAL_COMMUNITY): Payer: Self-pay | Admitting: Emergency Medicine

## 2014-10-17 DIAGNOSIS — R51 Headache: Secondary | ICD-10-CM | POA: Diagnosis not present

## 2014-10-17 DIAGNOSIS — K029 Dental caries, unspecified: Secondary | ICD-10-CM | POA: Diagnosis not present

## 2014-10-17 DIAGNOSIS — Z720 Tobacco use: Secondary | ICD-10-CM | POA: Diagnosis not present

## 2014-10-17 DIAGNOSIS — Z79899 Other long term (current) drug therapy: Secondary | ICD-10-CM | POA: Diagnosis not present

## 2014-10-17 DIAGNOSIS — K0889 Other specified disorders of teeth and supporting structures: Secondary | ICD-10-CM | POA: Insufficient documentation

## 2014-10-17 MED ORDER — OXYCODONE-ACETAMINOPHEN 5-325 MG PO TABS
1.0000 | ORAL_TABLET | Freq: Once | ORAL | Status: AC
  Administered 2014-10-17: 1 via ORAL
  Filled 2014-10-17: qty 1

## 2014-10-17 MED ORDER — OXYCODONE-ACETAMINOPHEN 5-325 MG PO TABS: 2.0000 | ORAL_TABLET | ORAL | Status: DC | PRN

## 2014-10-17 MED ORDER — NAPROXEN 500 MG PO TABS: 500.0000 mg | ORAL_TABLET | Freq: Two times a day (BID) | ORAL | Status: DC

## 2014-10-17 NOTE — ED Provider Notes (Signed)
CSN: [id number]     Arrival date & time 10/17/14  2020 History   By signing my name below, I, Erling Conte, attest that this documentation has been prepared under the direction and in the presence of .Ottie Glazier PA-C Electronically Signed: Erling Conte, ED Scribe. 10/17/2014. 11:24 PM.    Chief Complaint  Patient presents with  . Dental Pain    The history is provided by the patient. No language interpreter was used.    HPI Comments: Andre Padilla is a 29 y.o. male who presents to the Emergency Department complaining of constant, moderate, left upper dental pain onset 1 week. Pt states part of his tooth broke off and it now has a hole in it. He states the pain is radiating up from his left jaw and he is having an associated HA. Pt endorses the pain is exacerbated by eating. He has taken Ibuprofen and Vicodin with no significant relief. Pt does not have a dentist that he regularly follows up. He denies any fever, chills or trouble swallowing.  History reviewed. No pertinent past medical history. Past Surgical History  Procedure Laterality Date  . Foot surgery     No family history on file. Social History  Substance Use Topics  . Smoking status: Current Every Day Smoker -- 0.15 packs/day    Types: Cigarettes  . Smokeless tobacco: None  . Alcohol Use: No    Review of Systems  Constitutional: Negative for fever and chills.  HENT: Positive for dental problem. Negative for trouble swallowing.   Neurological: Positive for headaches.      Allergies  Review of patient's allergies indicates no known allergies.  Home Medications   Prior to Admission medications   Medication Sig Start Date End Date Taking? Authorizing Provider  HYDROcodone-acetaminophen (NORCO) 5-325 MG per tablet Take 1-2 tablets by mouth every 6 (six) hours as needed for severe pain. 11/09/13   Mercedes Camprubi-Soms, PA-C  magnesium citrate SOLN Take 148 mLs (0.5 Bottles total) by mouth once. If you  don't have a bowel movement after 2 hours, use the other 0.5 bottle. 11/09/13   Mercedes Camprubi-Soms, PA-C  naproxen (NAPROSYN) 500 MG tablet Take 1 tablet (500 mg total) by mouth 2 (two) times daily. 10/17/14   Ada Woodbury Patel-Mills, PA-C  oxyCODONE-acetaminophen (PERCOCET/ROXICET) 5-325 MG tablet Take 2 tablets by mouth every 4 (four) hours as needed for severe pain. 10/17/14   Lorane Cousar Patel-Mills, PA-C  polyethylene glycol (MIRALAX / GLYCOLAX) packet Take 17 g by mouth daily. 11/09/13   Mercedes Camprubi-Soms, PA-C  salicylic acid 17 % gel To think areas of feet and toes where warts are present 08/18/14   Delsa Grana, PA-C   Triage Vitals: BP 146/55 mmHg  Pulse 75  Temp(Src) 98.8 F (37.1 C) (Oral)  Resp 18  Ht 5\' 11"  (1.803 m)  Wt 211 lb (95.709 kg)  BMI 29.44 kg/m2  SpO2 99%  Physical Exam  Constitutional: He is oriented to person, place, and time. He appears well-developed and well-nourished. No distress.  HENT:  Head: Normocephalic and atraumatic.  He has multiple dental caries and poor dentition throughout No gum or facial swelling No palpable abscess Tenderness to left upper posterior tooth w/o fluctuance or drainage  Eyes: Conjunctivae and EOM are normal.  Neck: Neck supple. No tracheal deviation present.  Cardiovascular: Normal rate.   Pulmonary/Chest: Effort normal. No respiratory distress.  Musculoskeletal: Normal range of motion.  Lymphadenopathy:  No anterior cervical lymphadenopathy or swelling  Neurological: He is alert and  oriented to person, place, and time.  Skin: Skin is warm and dry.  Psychiatric: He has a normal mood and affect. His behavior is normal.  Nursing note and vitals reviewed.   ED Course  Procedures (including critical care time)  DIAGNOSTIC STUDIES: Oxygen Saturation is 99% on RA, normal by my interpretation.    COORDINATION OF CARE:  9:08 PM- Will prescribe pt with short course of Percocet 325mg  along with Naproxen. Will provide pt with  referral for dentist to follow up with.  Pt advised of plan for treatment and pt agrees.    Labs Review Labs Reviewed - No data to display  Imaging Review No results found.    EKG Interpretation None      MDM   Final diagnoses:  Pain, dental  Patient presents for dental pain. He has no signs of deep infection, abscess, or Ludwigs angina. I discussed dental follow-up and gave him several referrals. I also discussed return precautions and he verbally agrees with the plan. Medications  oxyCODONE-acetaminophen (PERCOCET/ROXICET) 5-325 MG per tablet 1 tablet (1 tablet Oral Given 10/17/14 2110)   Rx: Percocet, naproxen I personally performed the services described in this documentation, which was scribed in my presence. The recorded information has been reviewed and is accurate.   Ottie Glazier, PA-C 10/17/14 2325  Evelina Bucy, MD 10/18/14 [phone]

## 2014-10-17 NOTE — Discharge Instructions (Signed)
Dental Pain Follow up with a dentist. Return for fever, facial swelling, or difficulty breathing. A tooth ache may be caused by cavities (tooth decay). Cavities expose the nerve of the tooth to air and hot or cold temperatures. It may come from an infection or abscess (also called a boil or furuncle) around your tooth. It is also often caused by dental caries (tooth decay). This causes the pain you are having. DIAGNOSIS  Your caregiver can diagnose this problem by exam. TREATMENT   If caused by an infection, it may be treated with medications which kill germs (antibiotics) and pain medications as prescribed by your caregiver. Take medications as directed.  Only take over-the-counter or prescription medicines for pain, discomfort, or fever as directed by your caregiver.  Whether the tooth ache today is caused by infection or dental disease, you should see your dentist as soon as possible for further care. SEEK MEDICAL CARE IF: The exam and treatment you received today has been provided on an emergency basis only. This is not a substitute for complete medical or dental care. If your problem worsens or new problems (symptoms) appear, and you are unable to meet with your dentist, call or return to this location. SEEK IMMEDIATE MEDICAL CARE IF:   You have a fever.  You develop redness and swelling of your face, jaw, or neck.  You are unable to open your mouth.  You have severe pain uncontrolled by pain medicine. MAKE SURE YOU:   Understand these instructions.  Will watch your condition.  Will get help right away if you are not doing well or get worse. Document Released: 12/31/2004 Document Revised: 03/25/2011 Document Reviewed: 08/19/2007 [facility] Patient Information 2015 [place], [place]. This information is not intended to replace advice given to you by your health care provider. Make sure you discuss any questions you have with your health care provider.

## 2014-10-17 NOTE — ED Notes (Signed)
Pt. reports left upper molar pain onset last week .

## 2014-10-17 NOTE — ED Notes (Signed)
PA at bedside.

## 2014-12-26 ENCOUNTER — Encounter (HOSPITAL_COMMUNITY): Payer: Self-pay | Admitting: Emergency Medicine

## 2014-12-26 ENCOUNTER — Emergency Department (HOSPITAL_COMMUNITY)
Admission: EM | Admit: 2014-12-26 | Discharge: 2014-12-26 | Disposition: A | Discharge status: Home / Self Care | Payer: Medicaid Other | Attending: Emergency Medicine | Admitting: Emergency Medicine

## 2014-12-26 ENCOUNTER — Emergency Department (HOSPITAL_COMMUNITY)
Admission: EM | Admit: 2014-12-26 | Discharge: 2014-12-26 | Disposition: A | Discharge status: Home / Self Care | Payer: Medicaid Other | Source: Home / Self Care | Attending: Emergency Medicine | Admitting: Emergency Medicine

## 2014-12-26 DIAGNOSIS — F17210 Nicotine dependence, cigarettes, uncomplicated: Secondary | ICD-10-CM | POA: Diagnosis not present

## 2014-12-26 DIAGNOSIS — K0889 Other specified disorders of teeth and supporting structures: Secondary | ICD-10-CM

## 2014-12-26 DIAGNOSIS — R1012 Left upper quadrant pain: Secondary | ICD-10-CM

## 2014-12-26 DIAGNOSIS — K002 Abnormalities of size and form of teeth: Secondary | ICD-10-CM | POA: Diagnosis not present

## 2014-12-26 LAB — CBC
HCT: 39.8 % (ref 39.0–52.0)
Hemoglobin: 12.5 g/dL — ABNORMAL LOW (ref 13.0–17.0)
MCH: 21 pg — ABNORMAL LOW (ref 26.0–34.0)
MCHC: 31.4 g/dL (ref 30.0–36.0)
MCV: 66.8 fL — ABNORMAL LOW (ref 78.0–100.0)
PLATELETS: 258 10*3/uL (ref 150–400)
RBC: 5.96 MIL/uL — AB (ref 4.22–5.81)
RDW: 16.3 % — ABNORMAL HIGH (ref 11.5–15.5)
WBC: 7.6 10*3/uL (ref 4.0–10.5)

## 2014-12-26 LAB — COMPREHENSIVE METABOLIC PANEL
ALK PHOS: 49 U/L (ref 38–126)
ALT: 12 U/L — AB (ref 17–63)
AST: 18 U/L (ref 15–41)
Albumin: 4.2 g/dL (ref 3.5–5.0)
Anion gap: 8 (ref 5–15)
BUN: 12 mg/dL (ref 6–20)
CALCIUM: 9.2 mg/dL (ref 8.9–10.3)
CO2: 23 mmol/L (ref 22–32)
CREATININE: 0.88 mg/dL (ref 0.61–1.24)
Chloride: 108 mmol/L (ref 101–111)
Glucose, Bld: 87 mg/dL (ref 65–99)
Potassium: 4.4 mmol/L (ref 3.5–5.1)
Sodium: 139 mmol/L (ref 135–145)
Total Bilirubin: 0.8 mg/dL (ref 0.3–1.2)
Total Protein: 6.7 g/dL (ref 6.5–8.1)

## 2014-12-26 LAB — URINALYSIS, DIPSTICK ONLY
Bilirubin Urine: NEGATIVE
Glucose, UA: NEGATIVE mg/dL
HGB URINE DIPSTICK: NEGATIVE
Ketones, ur: NEGATIVE mg/dL
LEUKOCYTES UA: NEGATIVE
Nitrite: NEGATIVE
Protein, ur: NEGATIVE mg/dL
Specific Gravity, Urine: 1.024 (ref 1.005–1.030)
pH: 7 (ref 5.0–8.0)

## 2014-12-26 LAB — LIPASE, BLOOD: Lipase: 26 U/L (ref 11–51)

## 2014-12-26 MED ORDER — ONDANSETRON HCL 4 MG/2ML IJ SOLN
4.0000 mg | Freq: Once | INTRAMUSCULAR | Status: AC
  Administered 2014-12-26: 4 mg via INTRAVENOUS
  Filled 2014-12-26: qty 2

## 2014-12-26 MED ORDER — FENTANYL CITRATE (PF) 100 MCG/2ML IJ SOLN
100.0000 ug | Freq: Once | INTRAMUSCULAR | Status: AC
  Administered 2014-12-26: 100 ug via INTRAVENOUS
  Filled 2014-12-26: qty 2

## 2014-12-26 MED ORDER — NAPROXEN 375 MG PO TABS: 375.0000 mg | ORAL_TABLET | Freq: Two times a day (BID) | ORAL | Status: DC

## 2014-12-26 MED ORDER — SODIUM CHLORIDE 0.9 % IV BOLUS (SEPSIS)
1000.0000 mL | Freq: Once | INTRAVENOUS | Status: AC
  Administered 2014-12-26: 1000 mL via INTRAVENOUS

## 2014-12-26 MED ORDER — KETOROLAC TROMETHAMINE 15 MG/ML IJ SOLN
15.0000 mg | Freq: Once | INTRAMUSCULAR | Status: AC
  Administered 2014-12-26: 15 mg via INTRAVENOUS
  Filled 2014-12-26: qty 1

## 2014-12-26 MED ORDER — HYDROCODONE-ACETAMINOPHEN 5-325 MG PO TABS
1.0000 | ORAL_TABLET | Freq: Once | ORAL | Status: AC
  Administered 2014-12-26: 1 via ORAL
  Filled 2014-12-26: qty 1

## 2014-12-26 NOTE — ED Notes (Signed)
Pt reports upper left toothache x 3 days, unable to get any relief although pain meds, unable to eat as well, per pt.

## 2014-12-26 NOTE — ED Provider Notes (Signed)
CSN: [id number]     Arrival date & time 12/26/14  [id number] History   First MD Initiated Contact with Patient 12/26/14 1008     Chief Complaint  Patient presents with  . Abdominal Pain  . Dental Pain   Patient is a 29 y.o. male presenting with abdominal pain. The history is provided by the patient.  Abdominal Pain Pain location:  L flank Pain quality: aching   Pain radiation: across abdomen. Pain severity:  Moderate Onset quality:  Gradual Duration:  2 weeks Timing:  Constant Progression:  Worsening Chronicity:  New Context: not previous surgeries, not sick contacts, not suspicious food intake and not trauma   Relieved by:  Nothing Ineffective treatments:  NSAIDs Associated symptoms: no chest pain, no chills, no constipation, no cough, no diarrhea, no dysuria, no fever, no hematuria, no nausea, no shortness of breath, no sore throat and no vomiting     History reviewed. No pertinent past medical history. Past Surgical History  Procedure Laterality Date  . Foot surgery     No family history on file. Social History  Substance Use Topics  . Smoking status: Current Every Day Smoker -- 0.15 packs/day    Types: Cigarettes  . Smokeless tobacco: None  . Alcohol Use: No    Review of Systems  Constitutional: Negative for fever and chills.  HENT: Positive for dental problem. Negative for rhinorrhea and sore throat.   Eyes: Negative for visual disturbance.  Respiratory: Negative for cough and shortness of breath.   Cardiovascular: Negative for chest pain.  Gastrointestinal: Positive for abdominal pain. Negative for nausea, vomiting, diarrhea and constipation.  Genitourinary: Negative for dysuria and hematuria.  Musculoskeletal: Negative for back pain and neck pain.  Skin: Negative for rash.  Neurological: Negative for syncope and headaches.  Psychiatric/Behavioral: Negative for confusion.  All other systems reviewed and are negative.     Allergies  Review of patient's  allergies indicates no known allergies.  Home Medications   Prior to Admission medications   Medication Sig Start Date End Date Taking? Authorizing Provider  HYDROcodone-acetaminophen (NORCO) 5-325 MG per tablet Take 1-2 tablets by mouth every 6 (six) hours as needed for severe pain. Patient not taking: Reported on 12/26/2014 11/09/13   Mercedes Camprubi-Soms, PA-C  magnesium citrate SOLN Take 148 mLs (0.5 Bottles total) by mouth once. If you don't have a bowel movement after 2 hours, use the other 0.5 bottle. Patient not taking: Reported on 12/26/2014 11/09/13   Mercedes Camprubi-Soms, PA-C  naproxen (NAPROSYN) 375 MG tablet Take 1 tablet (375 mg total) by mouth 2 (two) times daily. 12/26/14   Samantha Tripp Dowless, PA-C  naproxen (NAPROSYN) 500 MG tablet Take 1 tablet (500 mg total) by mouth 2 (two) times daily. Patient not taking: Reported on 12/26/2014 10/17/14   Ottie Glazier, PA-C  oxyCODONE-acetaminophen (PERCOCET/ROXICET) 5-325 MG tablet Take 2 tablets by mouth every 4 (four) hours as needed for severe pain. Patient not taking: Reported on 12/26/2014 10/17/14   Ottie Glazier, PA-C  polyethylene glycol (MIRALAX / GLYCOLAX) packet Take 17 g by mouth daily. Patient not taking: Reported on 12/26/2014 11/09/13   Mercedes Camprubi-Soms, PA-C  salicylic acid 17 % gel To think areas of feet and toes where warts are present Patient not taking: Reported on 12/26/2014 08/18/14   Delsa Grana, PA-C   BP 105/74 mmHg  Pulse 65  Temp(Src) 97.6 F (36.4 C) (Oral)  Resp 18  Ht 5\' 11"  (1.803 m)  Wt 86.24 kg  BMI 26.53 kg/m2  SpO2 99% Physical Exam  Constitutional: He is oriented to person, place, and time. He appears well-developed and well-nourished. No distress.  HENT:  Head: Normocephalic and atraumatic.  Mouth/Throat: Oropharynx is clear and moist. No dental abscesses or uvula swelling. No oropharyngeal exudate or posterior oropharyngeal edema.  No facial swelling or erythema  Eyes:  EOM are normal.  Neck: Neck supple. No JVD present.  Cardiovascular: Normal rate, regular rhythm, normal heart sounds and intact distal pulses.   Pulmonary/Chest: Effort normal and breath sounds normal.  Abdominal: Soft. He exhibits no distension. There is tenderness in the left upper quadrant. There is no rigidity, no guarding and no CVA tenderness.  Musculoskeletal: Normal range of motion. He exhibits no edema.  Neurological: He is alert and oriented to person, place, and time. No cranial nerve deficit.  Skin: Skin is warm and dry.  Psychiatric: His behavior is normal.    ED Course  Procedures  None   Labs Review Labs Reviewed  COMPREHENSIVE METABOLIC PANEL - Abnormal; Notable for the following:    ALT 12 (*)    All other components within normal limits  CBC - Abnormal; Notable for the following:    RBC 5.96 (*)    Hemoglobin 12.5 (*)    MCV 66.8 (*)    MCH 21.0 (*)    RDW 16.3 (*)    All other components within normal limits  LIPASE, BLOOD  URINALYSIS, DIPSTICK ONLY   MDM   Final diagnoses:  Pain, dental  LUQ pain    29 year old Afro-American male presents with left flank pain and dental pain. The dental pain is not new. He has been having ongoing symptoms since October. He was seen in the ED at that time and was prescribed a short course of narcotics and anti-inflammatory medications. He was instructed to follow-up with a dentist which he has not. Dental exam is unremarkable and without abscess. Do not fill emergent intervention indicated at this time. Patient complains of left flank pain that has been ongoing since December last year. His aunt feels that is been getting worse over the last 2 weeks. Patient states the pain radiates across his abdomen. He denies urinary urgency, frequency, dysuria, hematuria. CT from 11/09/2013 showed nonspecific left renal findings. Patient's abdomen is soft without peritonitis. Moderate tenderness to palpation the left upper quadrant. We'll  obtain labs, urine dipstick and provide IV fluids, antiemetics, IV Toradol.   I independently reviewed and interpreted available labs and imaging studies. Mild anemia from prior studies in August. Normal LFTs, kidney function, WBC count. Doubt infectious or hepatobiliary etiology. No abdominal trauma, h/o malignancy, recent URI symptoms or h/o sickle cell - doubt splenic sequestration or rupture. No microscopic hematuria - doubt kidney stone. I reiterated to him the importance of seeing a dentist. He is upset that I cannot pull his tooth.  Resources provided. I provided verbal discharge instructions including follow up and return precautions.     Discussed with Dr. Angelina Pih.  Gustavus Bryant, MD 12/26/14 1545  Elnora Morrison, MD 12/26/14 [phone]

## 2014-12-26 NOTE — ED Notes (Signed)
Pt c/o abdominal pain ongoing for 4 weeks. Pt also c/o left upper toothache x 4 days.

## 2014-12-26 NOTE — ED Notes (Signed)
Pt up to the bathroom

## 2014-12-26 NOTE — Progress Notes (Signed)
Entered in d/c instructions Pa, [facility] Schedule an appointment as soon as possible for a visit This is your assigned Medicaid [address] access doctor If you prefer another contact [place] assigned your doctor *You may receive a bill if you go to any family Dr not assigned to you [place] [address] [id number] Medicaid [place] Access Covered Patient USE THIS [place], [place] Medicaid Transportation to Dr appts if you are have full Medicaid: [phone], [phone] or [phone] Transportation Supervisor [phone] As a Medicaid client you MUST contact DSS/SSI each time you change address, move to another [place] or another state to keep your address updated  [place]: [place], [place] [address] [email or web address]

## 2014-12-26 NOTE — Discharge Instructions (Signed)
Abdominal Pain, Adult °Many things can cause abdominal pain. Usually, abdominal pain is not caused by a disease and will improve without treatment. It can often be observed and treated at home. Your health care provider will do a physical exam and possibly order blood tests and X-rays to help determine the seriousness of your pain. However, in many cases, more time must pass before a clear cause of the pain can be found. Before that point, your health care provider may not know if you need more testing or further treatment. °HOME CARE INSTRUCTIONS °Monitor your abdominal pain for any changes. The following actions may help to alleviate any discomfort you are experiencing: °· Only take over-the-counter or prescription medicines as directed by your health care provider. °· Do not take laxatives unless directed to do so by your health care provider. °· Try a clear liquid diet (broth, tea, or water) as directed by your health care provider. Slowly move to a bland diet as tolerated. °SEEK MEDICAL CARE IF: °· You have unexplained abdominal pain. °· You have abdominal pain associated with nausea or diarrhea. °· You have pain when you urinate or have a bowel movement. °· You experience abdominal pain that wakes you in the night. °· You have abdominal pain that is worsened or improved by eating food. °· You have abdominal pain that is worsened with eating fatty foods. °· You have a fever. °SEEK IMMEDIATE MEDICAL CARE IF: °· Your pain does not go away within 2 hours. °· You keep throwing up (vomiting). °· Your pain is felt only in portions of the abdomen, such as the right side or the left lower portion of the abdomen. °· You pass bloody or black tarry stools. °MAKE SURE YOU: °· Understand these instructions. °· Will watch your condition. °· Will get help right away if you are not doing well or get worse. °  °This information is not intended to replace advice given to you by your health care provider. Make sure you discuss  any questions you have with your health care provider. °  °Document Released: 10/10/2004 Document Revised: 09/21/2014 Document Reviewed: 09/09/2012 °Elsevier Interactive Patient Education ©2016 Elsevier Inc. ° ° °Emergency Department Resource Guide °1) Find a Doctor and Pay Out of Pocket °Although you won't have to find out who is covered by your insurance plan, it is a good idea to ask around and get recommendations. You will then need to call the office and see if the doctor you have chosen will accept you as a new patient and what types of options they offer for patients who are self-pay. Some doctors offer discounts or will set up payment plans for their patients who do not have insurance, but you will need to ask so you aren't surprised when you get to your appointment. ° °2) Contact Your Local Health Department °Not all health departments have doctors that can see patients for sick visits, but many do, so it is worth a call to see if yours does. If you don't know where your local health department is, you can check in your phone book. The CDC also has a tool to help you locate your state's health department, and many state websites also have listings of all of their local health departments. ° °3) Find a Walk-in Clinic °If your illness is not likely to be very severe or complicated, you may want to try a walk in clinic. These are popping up all over the country in pharmacies, drugstores, and shopping centers.   They're usually staffed by nurse practitioners or physician assistants that have been trained to treat common illnesses and complaints. They're usually fairly quick and inexpensive. However, if you have serious medical issues or chronic medical problems, these are probably not your best option.  No Primary Care Doctor: - Call Health Connect at  [phone] - they can help you locate a primary care doctor that  accepts your insurance, provides certain services, etc. - Physician Referral Service-  [phone]  Chronic Pain Problems: Organization         Address  Phone   Notes  [facility]  [phone] Patients need to be referred by their primary care doctor.   Medication Assistance: Organization         Address  Phone   Notes  [place] [facility] [address] [place], [place] [address] [phone] --Must be a resident of [facility] -- Must have NO insurance coverage whatsoever (no Medicaid/ Medicare, etc.) -- The pt. MUST have a primary care doctor that directs their care regularly and follows them in the community   MedAssist  [phone]   [employer]  [phone]    Agencies that provide inexpensive medical care: Organization         Address  Phone   Notes  [place]  [phone]   [facility]    [phone]   [facility] [place], [place] [address] [phone]   [place] [address], [place] [phone]   Planned Parenthood    [phone]   [facility]    [phone]   [place] and [place] [address], [place] Phone:  [phone], Fax:  [phone] Hours of Operation:  9 am - 6 pm, M-F.  Also accepts Medicaid/Medicare and self-pay.  [facility] for [place], [address], [place] Phone: [phone], Fax: [phone]. Hours of Operation:  8:30 am - 5:30 pm, M-F.  Also accepts Medicaid and self-pay.  [facility] [address], [place] Phone: [phone]   [place], [place], [place] [phone], Ext. [phone] Mondays & Thursdays: 7-9 AM.  First 15 patients are seen on a first come, first serve basis.    [place] Providers:  Organization         Address  Phone   Notes  [facility] [address],  [place] [phone] Also accepts self-pay patients.  [facility] [id number] [place], [place]  [phone]   [place], [address], [place] [phone]   [facility] [address], [place] [phone]   Lucianne Lei [address], [place]   [phone] Only accepts [place] Access [place] patients after they have their name applied to their card.   Self-Pay (no insurance) in [facility]:  Organization         Address  Phone   Notes  Sickle Cell Patients, [facility] [place] [phone]   [facility] [place] [phone]   [facility]  [place] [address], [place], [place] [phone]  [facility]/Dr. Osei-Bonsu  [address] [place] or [address], [place] [phone] Phone number for both [place] and [place] locations is the same.  [facility]gent [facility] and [facility] [address], [place] [phone]   [facility] [address] [place] or [address] [phone]   [facility] [address] [place] [phone], phone; [phone], fax Sees patients 1st and 3rd Saturday of every month.  Must not qualify for public or private insurance (i.e. Medicaid, Medicare, [place] Health Choice, Veterans' Benefits)  Household income should be no more than 200% of the poverty level The clinic cannot treat you if you are pregnant or think you are pregnant  Sexually transmitted diseases are not treated at the clinic.    Dental Care: Organization         Address  Phone  Notes  [facility] Department of [facility] [place] [phone] Accepts children up to age 68 who are enrolled in  [place] or [place]; pregnant women with a Medicaid card; and children who have applied for Medicaid or [place] Health Choice, but were declined, whose parents can pay a reduced fee at time of service.  [facility] Department of [facility]  [address], [place] [phone] Accepts children up to age 47 who are enrolled in [place] or [place]; pregnant women with a Medicaid card; and children who have applied for Medicaid or  Health Choice, but were declined, whose parents can pay a reduced fee at time of service.  [place] Adult Dental Access PROGRAM  [place] [phone] Patients are seen by appointment only. Walk-ins are not accepted. [place] will see patients 9 years of age and older. Monday - Tuesday (8am-5pm) Most Wednesdays (8:30-5pm) $30 per visit, cash only  [facility] Adult Dental Access PROGRAM  [address], [facility] [phone] Patients are seen by appointment only. Walk-ins are not accepted. [place] will see patients 44 years of age and older. One Wednesday Evening (Monthly: Volunteer Based).  $30 per visit, cash only  [place]  [phone] for adults; Children under age 36, call Graduate Pediatric Dentistry at [phone]. Children aged 43-14, please call [phone] to request a pediatric application.  Dental services are provided in all areas of dental care including fillings, crowns and bridges, complete and partial dentures, implants, gum treatment, root canals, and extractions. Preventive care is also provided. Treatment is provided to both adults and children. Patients are selected via a lottery and there is often a waiting list.   [facility] [address] [place]  [phone] [email or web address]   [facility] [address] [place], [place] [phone], Ext. [phone] Second and Fourth Thursday of each month, opens at 6:30  AM; Clinic ends at 9 AM.  Patients are seen on a first-come first-served basis, and a limited number are seen during each clinic.   [facility]  [address] Hillard Danker [place], [place] [phone]   Eligibility Requirements You must have lived in [place], [place], or [place] counties for at least the last three months.   You cannot be eligible for state or federal sponsored [employer], including [employer], [place], or [employer].   You generally cannot be eligible for healthcare insurance  through your employer.    How to apply: Eligibility screenings are held every Tuesday and Wednesday afternoon from 1:00 pm until 4:00 pm. You do not need an appointment for the interview!  [facility] [address] [place], [place]   [place]  [place] Department  [place]  [phone]    Behavioral Health Resources in the Community: Intensive Outpatient Programs Organization         Address  Phone  Notes  [place]. [address], [place], [place] [phone]   [facility] [address], [place], [place]   ADS: Alcohol & Drug [facility] [address] [place], [place]   [place] [address]  [place], [place] or [phone]   Substance Abuse Resources Organization         Address  Phone  Notes  Alcohol and Drug Services  [phone]   [place]  [phone]   [facility] [place]   Chinita Pester  [phone]   Residential & Outpatient Substance Abuse Program  [phone]   Psychological Services Organization         Address  Phone  Notes  [facility] [place]  [place]  [phone]   [place] [address], [place] or  [phone]    Mobile Crisis Teams Organization         Address  Phone  Notes  [facility], Mobile Crisis Care Unit  [phone]   [facility]  [address] [place], [place]   [place] Levels [address] [place] [phone]    Self-Help/Support Groups Organization         Address  Phone             Notes  [place]. of [place] - variety of support groups  [place] Call for more information  Narcotics Anonymous (NA), [facility] [address], [employer] [place]  2 meetings at this location   Special educational needs teacher         Address  Phone  Notes  [facility] Residential Treatment [place],    [place]  [phone]   [facility]  [address] [place] [id number], [place], [place]   [place]ishram Timpson, [place] [phone] Admissions: 8am-3pm M-F  Incentives Substance [place] [address],    [place], [place] [phone]   [facility] [address] [place], [place], [place]   [facility] [address],  [place], [place]   [facility] - Intensive Outpatient [address] Kristeen Mans [address], [place], [place]   [facility] ([place].,  [place], [place] [phone] or [phone]   [facility] (RTS) [address] [place], [place] Accepts Medicaid  [facility] [place] [address],  [place] [phone] Substance Abuse/Addiction Treatment   [facility] ([place]) Organization         Address  Phone  Notes  [facility]  [phone]   Domenic Schwab, PhD [address] [place], [place]   [phone] or [phone]   [place], [place] [phone]  [facility] [address]  [place], [place] [phone] Insurance/Medicaid/sponsorship through [employer] and Families [address], [address] [place], [place] [phone] [place], [place] [phone]    Dr. Adele Schilder  [phone]   [facility] Dept. 1) [address] [place] 2) [place] 3)  [place] [address], [place] [phone]  [phone]   [place] [phone] or [phone] (After Hours)

## 2014-12-26 NOTE — ED Notes (Signed)
Pt ambulated to the bathroom with ease. Pt unable to give urine sample at this time. Pt will try again in 30 minutes

## 2014-12-26 NOTE — ED Provider Notes (Signed)
CSN: [id number]     Arrival date & time 12/26/14  1443 History  By signing my name below, I, Erling Conte, attest that this documentation has been prepared under the direction and in the presence of  Carlos Levering, PA-C  Electronically Signed: Erling Conte, ED Scribe. 12/26/2014. 3:31 PM.    Chief Complaint  Patient presents with  . Dental Pain   The history is provided by the patient. No language interpreter was used.    HPI Comments: Andre Padilla is a 29 y.o. male who presents to the Emergency Department complaining of constant, moderate, left upper tooth pain onset 3 days. He states he was eating when he felt the initial pain. He reports the pain is exacerbated with eating. Pt has been taking OTC pain medications with no significant relief. He denies any other alleviating/aggravating factors. Per chart, pt was seen at [facility] this morning for the same. He does not have a dentist that he regularly follows up with. He denies any fever, chills, facial swelling, trouble swallowing, difficulty breathing, trismus or other associated symptoms.  History reviewed. No pertinent past medical history. Past Surgical History  Procedure Laterality Date  . Foot surgery     No family history on file. Social History  Substance Use Topics  . Smoking status: Current Every Day Smoker -- 0.15 packs/day    Types: Cigarettes  . Smokeless tobacco: None  . Alcohol Use: No    Review of Systems  All other systems reviewed and are negative.     Allergies  Review of patient's allergies indicates no known allergies.  Home Medications   Prior to Admission medications   Medication Sig Start Date End Date Taking? Authorizing Provider  HYDROcodone-acetaminophen (NORCO) 5-325 MG per tablet Take 1-2 tablets by mouth every 6 (six) hours as needed for severe pain. Patient not taking: Reported on 12/26/2014 11/09/13   Mercedes Camprubi-Soms, PA-C  magnesium citrate SOLN Take 148  mLs (0.5 Bottles total) by mouth once. If you don't have a bowel movement after 2 hours, use the other 0.5 bottle. Patient not taking: Reported on 12/26/2014 11/09/13   Mercedes Camprubi-Soms, PA-C  naproxen (NAPROSYN) 500 MG tablet Take 1 tablet (500 mg total) by mouth 2 (two) times daily. Patient not taking: Reported on 12/26/2014 10/17/14   Ottie Glazier, PA-C  oxyCODONE-acetaminophen (PERCOCET/ROXICET) 5-325 MG tablet Take 2 tablets by mouth every 4 (four) hours as needed for severe pain. Patient not taking: Reported on 12/26/2014 10/17/14   Ottie Glazier, PA-C  polyethylene glycol (MIRALAX / GLYCOLAX) packet Take 17 g by mouth daily. Patient not taking: Reported on 12/26/2014 11/09/13   Mercedes Camprubi-Soms, PA-C  salicylic acid 17 % gel To think areas of feet and toes where warts are present Patient not taking: Reported on 12/26/2014 08/18/14   Delsa Grana, PA-C   Triage Vitals: BP 121/57 mmHg  Pulse 74  Temp(Src) 98 F (36.7 C) (Oral)  Resp 16  SpO2 99%  Physical Exam  Constitutional: He is oriented to person, place, and time. He appears well-developed and well-nourished. No distress.  HENT:  Head: Normocephalic and atraumatic.  Mouth/Throat: Uvula is midline, oropharynx is clear and moist and mucous membranes are normal. No trismus in the jaw. Abnormal dentition. No dental abscesses, uvula swelling or dental caries. No oropharyngeal exudate, posterior oropharyngeal edema, posterior oropharyngeal erythema or tonsillar abscesses.  Eyes: Conjunctivae are normal. Right eye exhibits no discharge. Left eye exhibits no discharge. No scleral icterus.  Neck: Neck supple.  Cardiovascular: Normal rate.   Pulmonary/Chest: Effort normal.  Lymphadenopathy:    He has no cervical adenopathy.  Neurological: He is alert and oriented to person, place, and time. Coordination normal.  Skin: Skin is warm and dry. No rash noted. He is not diaphoretic. No erythema. No pallor.  Psychiatric: He  has a normal mood and affect. His behavior is normal.  Nursing note and vitals reviewed.   ED Course  Procedures (including critical care time)  DIAGNOSTIC STUDIES: Oxygen Saturation is 99% on RA, normal by my interpretation.    COORDINATION OF CARE: 3:40 PM- Will provide pt with dental referral. Will order Vicodin to administer while in ER. Will d/c pt home with rx for Naproxen.  Pt advised of plan for treatment and pt agrees.     Labs Review Labs Reviewed - No data to display  Imaging Review No results found.    EKG Interpretation None      MDM   Final diagnoses:  Pain, dental    Pt here for dental pain. Patient was seen earlier at [facility] for dental pain as well as flank pain. He received fentanyl and Toradol for pain medication at that time. On exam today no evidence of abscess. No erythema. No trismus. No evidence of Ludwig's angina. No tooth avulsion. Patient was seen multiple times in the last several months for dental pain for which she has not followed up with a dentist. Do not see indication for antibiotics at this time. Encourage patient to follow up with dentist. 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 Will give prescription for naproxen for pain. Will not give narcotics at this time. Discussed treatment plan with patient. Return precautions outlined in patient discharge instructions.  I personally performed the services described in this documentation, which was scribed in my presence. The recorded information has been reviewed and is accurate.      Dondra Spry Reasnor, PA-C 12/27/14 1900  Lacretia Leigh, MD 12/29/14 [phone]

## 2014-12-26 NOTE — Discharge Instructions (Signed)
Follow up with dentist as soon as possible for evaluation. Return to the ED if you experience fever, difficulty breathing or swallowing, lip/tongue/facial swelling.    Emergency Department Resource Guide 1) Find a Doctor and Pay Out of Pocket Although you won't have to find out who is covered by your insurance plan, it is a good idea to ask around and get recommendations. You will then need to call the office and see if the doctor you have chosen will accept you as a new patient and what types of options they offer for patients who are self-pay. Some doctors offer discounts or will set up payment plans for their patients who do not have insurance, but you will need to ask so you aren't surprised when you get to your appointment.  2) Contact Your Local Health Department Not all health departments have doctors that can see patients for sick visits, but many do, so it is worth a call to see if yours does. If you don't know where your local health department is, you can check in your phone book. The CDC also has a tool to help you locate your state's health department, and many state websites also have listings of all of their local health departments.  3) Find a [facility] If your illness is not likely to be very severe or complicated, you may want to try a walk in clinic. These are popping up all over the country in pharmacies, drugstores, and shopping centers. They're usually staffed by nurse practitioners or physician assistants that have been trained to treat common illnesses and complaints. They're usually fairly quick and inexpensive. However, if you have serious medical issues or chronic medical problems, these are probably not your best option.  No Primary Care Doctor: - Call Health Connect at  [phone] - they can help you locate a primary care doctor that  accepts your insurance, provides certain services, etc. - Physician Referral Service- [phone]  Chronic Pain  Problems: Organization         Address  Phone   Notes  [facility]  [phone] Patients need to be referred by their primary care doctor.   Medication Assistance: Organization         Address  Phone   Notes  [facility] Medication [facility] [address] [place], [place] [address] [phone] --Must be a resident of [facility] -- Must have NO insurance coverage whatsoever (no Medicaid/ Medicare, etc.) -- The pt. MUST have a primary care doctor that directs their care regularly and follows them in the community   MedAssist  [phone]   [employer]  [phone]    Agencies that provide inexpensive medical care: Organization         Address  Phone   Notes  [place]  [phone]   [facility]    [phone]   [facility] [place], [place] [address] [phone]   [place] [address] [place] [phone]   Planned Parenthood    [phone]   [facility]    [phone]   [place] [address] [place] [address], [place] Phone:  [phone], Fax:  [phone] Hours of Operation:  9 am - 6 pm, M-F.  Also accepts Medicaid/Medicare and self-pay.  [facility] for [facility] [address], [address], [place]  Phone: [phone], Fax: [phone]. Hours of Operation:  8:30 am - 5:30 pm, M-F.  Also accepts Medicaid and self-pay.  [facility] [place] [address] [place] Phone: [phone]   [place], [place], [place] [phone], Ext. [phone] Mondays & Thursdays: 7-9 AM.  First 15 patients are seen on a first come, first serve basis.    [place] Providers:  Organization         Address  Phone   Notes  [facility] [address], [place] [phone] Also  accepts self-pay patients.  [facility] [address], [place]  [phone]   [place], [address], [place] [phone]   [facility] [address], [place] [phone]   Lucianne Lei [address], [place]   [phone] Only accepts [place] Access [place] patients after they have their name applied to their card.   Self-Pay (no insurance) in [facility]:  Organization         Address  Phone   Notes  Sickle Cell Patients, [facility] [place] [phone]   [facility] Urgent Care [place] [phone]   [facility] [place]  [place], [place], [place] [phone]   [facility]/Dr. Osei-Bonsu  [address], [place] or [address], [place] [phone] Phone number for both [place] and [place] locations is the same.  Urgent Medical and [facility] [address] [place] [phone]   [facility] [address], [place] or [address] [phone]   [facility] [address] [place] [phone], phone; [phone], fax Sees patients 1st and 3rd Saturday of every month.  Must not qualify for public or private insurance (i.e. Medicaid, Medicare, [place] Health Choice, Veterans' Benefits)  Household income should be no more than 200% of the poverty level The clinic cannot treat you if you are pregnant or think you are pregnant  Sexually transmitted diseases are not treated at the clinic.    Dental Care: Organization         Address  Phone  Notes  [facility] Department of [facility] [place] [phone] Accepts children up to age 76 who are enrolled in [place] or [place]; pregnant  women with a Medicaid card; and children who have applied for Medicaid or [place] Health Choice, but were declined, whose parents can pay a reduced fee at time of service.  [facility] Department of [facility]  [address], [place] [phone] Accepts children up to age 23 who are enrolled in [place] or [place]; pregnant women with a Medicaid card; and children who have applied for Medicaid or [place] Health Choice, but were declined, whose parents can pay a reduced fee at time of service.  [facility]  [place] [phone] Patients are seen by appointment only. Walk-ins are not accepted. [place] will see patients 88 years of age and older. Monday - Tuesday (8am-5pm) Most Wednesdays (8:30-5pm) $30 per visit, cash only  [place]  [address], [facility] [phone] Patients are seen by appointment only. Walk-ins are not accepted. [place] will see patients 38 years of age and older. One Wednesday Evening (Monthly: Volunteer Based).  $30 per visit, cash only  [place]  [phone] for adults; Children under age 56, call Graduate Pediatric Dentistry at [phone]. Children aged 94-14, please call [phone] to request a pediatric application.  Dental services are provided in all areas of dental care including fillings, crowns and bridges, complete and partial dentures, implants, gum treatment, root canals, and extractions. Preventive care is also provided. Treatment is provided to both adults and children. Patients are selected via a lottery and there is often a waiting list.   [facility] [address] [place]  [phone] [email or web address]   [facility] [address] [place], [place] [phone], Ext. [phone] Second and Fourth Thursday of each month, opens at 6:30 AM; Clinic ends at 9 AM.  Patients are  seen on a first-come first-served basis, and a limited number are seen during each clinic.   [facility]  [address] Hillard Danker [place], [place] [phone]   Eligibility Requirements You must have lived in [place], [place], or [place] counties for at least the last three months.   You cannot be eligible for state or federal sponsored [employer], including [employer], [place], or [employer].   You generally cannot be eligible for healthcare insurance through your employer.    How to apply: Eligibility screenings are held every Tuesday and Wednesday afternoon from 1:00 pm until 4:00 pm. You do not need an appointment for the interview!  [facility] [address] [place], [place]   [place]  [place] Department  [place]  [phone]    Behavioral Health Resources in the Community: Intensive Outpatient Programs Organization         Address  Phone  Notes  [place]. [address] [place], [place] [phone]   [facility] Outpatient [address], [place], [place]   ADS: Alcohol & Drug Svcs [address] [place], [place]   [place] [address],  [place], [place] or [phone]   Substance Abuse Resources Organization         Address  Phone  Notes  Alcohol and Drug Services  [phone]   [place]  [phone]   The [place]   Chinita Pester  [phone]   Residential & Outpatient Substance Abuse Program  [phone]   Psychological Services Organization         Address  Phone  Notes  [facility]  [place]  [phone]   [place] [address] [place] or [phone]    Mobile Crisis  Teams Organization         Address  Phone  Notes  [facility]rapeutic Alternatives, Mobile Crisis Care Unit  [phone]   Assertive Psychotherapeutic Services  [address] [place], [place]   [place] Levels [address], [place] [phone]    Self-Help/Support Groups Organization         Address  Phone             Notes  Trainer. of [place] - variety of  support groups  [phone] Call for more information  Narcotics Anonymous (NA), Caring Services [address], [employer] [place]  2 meetings at this location   Residential Facilities manager         Address  Phone  Notes  [facility] Residential Treatment [place],    [place]  [phone]   [facility]  [address] [place] [id number], [place], [place]   [place], [place] [phone] Admissions: 8am-3pm M-F  Incentives Substance [place] [address],    [place], [place] [phone]   [facility] [address] [place], [place], [place]   [facility] [address]  [place], [place]   Insight Programs - Intensive Outpatient [address] Kristeen Mans [address], Lane, [place]   [facility] ([address] [place] [place].,  [place], [place] [phone] or [phone]   Residential Treatment Services (RTS) [address] [place], [place] Accepts Medicaid  [facility] [address],  [place] [phone] Substance Abuse/Addiction Treatment   [facility] Organization         Address  Phone  Notes  [facility]  [phone]   Domenic Schwab, PhD [address] Arlis Porta [place], [place]   [phone] or [phone]   Kemmerer Graves [place], [place] [phone]   [facility] [address], [place], [place] [phone]  Insurance/Medicaid/sponsorship through [facility] and Families [address], [address] [place]                                    [place], [place] [phone] [place] [address], [place] [phone]    Dr. Adele Schilder  [phone]   [facility] Dept. 1) [address] [place] 2) [place] 3)  [place] [address], [place] [phone]  [phone]   [place] [phone] or [phone] (After Hours)

## 2014-12-26 NOTE — ED Notes (Signed)
Provider at the bedside.  

## 2015-02-07 ENCOUNTER — Emergency Department (HOSPITAL_COMMUNITY): Payer: Medicaid Other

## 2015-02-07 ENCOUNTER — Emergency Department (HOSPITAL_COMMUNITY)
Admission: EM | Admit: 2015-02-07 | Discharge: 2015-02-07 | Disposition: A | Discharge status: Home / Self Care | Payer: Medicaid Other | Attending: Emergency Medicine | Admitting: Emergency Medicine

## 2015-02-07 ENCOUNTER — Encounter (HOSPITAL_COMMUNITY): Payer: Self-pay | Admitting: Emergency Medicine

## 2015-02-07 DIAGNOSIS — F17210 Nicotine dependence, cigarettes, uncomplicated: Secondary | ICD-10-CM | POA: Insufficient documentation

## 2015-02-07 DIAGNOSIS — R1013 Epigastric pain: Secondary | ICD-10-CM | POA: Insufficient documentation

## 2015-02-07 DIAGNOSIS — R109 Unspecified abdominal pain: Secondary | ICD-10-CM | POA: Diagnosis present

## 2015-02-07 DIAGNOSIS — R112 Nausea with vomiting, unspecified: Secondary | ICD-10-CM | POA: Diagnosis not present

## 2015-02-07 LAB — COMPREHENSIVE METABOLIC PANEL
ALT: 15 U/L — ABNORMAL LOW (ref 17–63)
AST: 19 U/L (ref 15–41)
Albumin: 4.9 g/dL (ref 3.5–5.0)
Alkaline Phosphatase: 54 U/L (ref 38–126)
Anion gap: 8 (ref 5–15)
BILIRUBIN TOTAL: 0.5 mg/dL (ref 0.3–1.2)
BUN: 10 mg/dL (ref 6–20)
CHLORIDE: 109 mmol/L (ref 101–111)
CO2: 26 mmol/L (ref 22–32)
CREATININE: 0.88 mg/dL (ref 0.61–1.24)
Calcium: 9.5 mg/dL (ref 8.9–10.3)
Glucose, Bld: 91 mg/dL (ref 65–99)
POTASSIUM: 4.3 mmol/L (ref 3.5–5.1)
Sodium: 143 mmol/L (ref 135–145)
TOTAL PROTEIN: 7.4 g/dL (ref 6.5–8.1)

## 2015-02-07 LAB — LIPASE, BLOOD: LIPASE: 24 U/L (ref 11–51)

## 2015-02-07 LAB — CBC
HEMATOCRIT: 42.3 % (ref 39.0–52.0)
Hemoglobin: 13.4 g/dL (ref 13.0–17.0)
MCH: 21.4 pg — ABNORMAL LOW (ref 26.0–34.0)
MCHC: 31.7 g/dL (ref 30.0–36.0)
MCV: 67.7 fL — AB (ref 78.0–100.0)
PLATELETS: 310 10*3/uL (ref 150–400)
RBC: 6.25 MIL/uL — AB (ref 4.22–5.81)
RDW: 16.1 % — ABNORMAL HIGH (ref 11.5–15.5)
WBC: 8.7 10*3/uL (ref 4.0–10.5)

## 2015-02-07 MED ORDER — SUCRALFATE 1 G PO TABS: 1.0000 g | ORAL_TABLET | Freq: Three times a day (TID) | ORAL | Status: DC

## 2015-02-07 MED ORDER — GI COCKTAIL ~~LOC~~
30.0000 mL | Freq: Once | ORAL | Status: AC
  Administered 2015-02-07: 30 mL via ORAL
  Filled 2015-02-07: qty 30

## 2015-02-07 MED ORDER — FAMOTIDINE 20 MG PO TABS: 20.0000 mg | ORAL_TABLET | Freq: Two times a day (BID) | ORAL | Status: DC

## 2015-02-07 MED ORDER — SUCRALFATE 1 G PO TABS
1.0000 g | ORAL_TABLET | Freq: Once | ORAL | Status: AC
  Administered 2015-02-07: 1 g via ORAL
  Filled 2015-02-07: qty 1

## 2015-02-07 NOTE — ED Notes (Addendum)
Per EMS, abdominal pain x 1 month. Was seen at [facility], said his "kidney's weren't working right." Pt states he can't eat or drink anything. Pain on left side and center radiating into back.   Denies dysuria, CP, SOB, fever/chills.

## 2015-02-07 NOTE — ED Provider Notes (Signed)
CSN: [id number]     Arrival date & time 02/07/15  1234 History   First MD Initiated Contact with Patient 02/07/15 1802     Chief Complaint  Patient presents with  . Abdominal Pain  . Emesis     (Consider location/radiation/quality/duration/timing/severity/associated sxs/prior Treatment) HPI Andre Padilla is a 30 y.o. male presents emergency department complaining of abdominal pain. Mother provides most of the history. Patient does tell me that his pain started proximally 3 months ago. He states is mainly at night. Pain is located in epigastric area that radiates to the left flank. Pain is worsened by eating. He states he is able to drink fluids without any issues, however after he eats he develops pain and he ends up throwing up most of everything he eats. Patient states he lost approximately 20 pounds in the last several months. Patient has been seen by his primary care doctor but they state that he was not told what was going on. Patient admits to eating poor diet, stating that he lives with a boarding house and eats a lot of greasy cooked food. Patient denies drugs or alcohol. He denies any diarrhea. Last normal bowel movement was this morning. He denies dysuria or urinary symptoms.  History reviewed. No pertinent past medical history. Past Surgical History  Procedure Laterality Date  . Foot surgery     History reviewed. No pertinent family history. Social History  Substance Use Topics  . Smoking status: Current Every Day Smoker -- 0.15 packs/day    Types: Cigarettes  . Smokeless tobacco: None  . Alcohol Use: No    Review of Systems  Constitutional: Negative for fever and chills.  Respiratory: Negative for cough, chest tightness and shortness of breath.   Cardiovascular: Negative for chest pain, palpitations and leg swelling.  Gastrointestinal: Positive for nausea, vomiting and abdominal pain. Negative for diarrhea and abdominal distention.  Genitourinary: Positive for flank  pain. Negative for dysuria, urgency, frequency and hematuria.  Musculoskeletal: Negative for myalgias, arthralgias, neck pain and neck stiffness.  Skin: Negative for rash.  Allergic/Immunologic: Negative for immunocompromised state.  Neurological: Negative for dizziness, weakness, light-headedness, numbness and headaches.  All other systems reviewed and are negative.     Allergies  Review of patient's allergies indicates no known allergies.  Home Medications   Prior to Admission medications   Medication Sig Start Date End Date Taking? Authorizing Provider  HYDROcodone-acetaminophen (NORCO) 5-325 MG per tablet Take 1-2 tablets by mouth every 6 (six) hours as needed for severe pain. Patient not taking: Reported on 12/26/2014 11/09/13   Mercedes Camprubi-Soms, PA-C  magnesium citrate SOLN Take 148 mLs (0.5 Bottles total) by mouth once. If you don't have a bowel movement after 2 hours, use the other 0.5 bottle. Patient not taking: Reported on 12/26/2014 11/09/13   Mercedes Camprubi-Soms, PA-C  naproxen (NAPROSYN) 375 MG tablet Take 1 tablet (375 mg total) by mouth 2 (two) times daily. Patient not taking: Reported on 02/07/2015 12/26/14   Samantha Tripp Dowless, PA-C  naproxen (NAPROSYN) 500 MG tablet Take 1 tablet (500 mg total) by mouth 2 (two) times daily. Patient not taking: Reported on 12/26/2014 10/17/14   Ottie Glazier, PA-C  oxyCODONE-acetaminophen (PERCOCET/ROXICET) 5-325 MG tablet Take 2 tablets by mouth every 4 (four) hours as needed for severe pain. Patient not taking: Reported on 12/26/2014 10/17/14   Ottie Glazier, PA-C  polyethylene glycol (MIRALAX / GLYCOLAX) packet Take 17 g by mouth daily. Patient not taking: Reported on 12/26/2014 11/09/13   Dewitt Hoes  Camprubi-Soms, PA-C  salicylic acid 17 % gel To think areas of feet and toes where warts are present Patient not taking: Reported on 12/26/2014 08/18/14   Delsa Grana, PA-C   BP 126/70 mmHg  Pulse 73  Temp(Src) 98.3 F  (36.8 C) (Oral)  Resp 18  SpO2 99% Physical Exam  Constitutional: He is oriented to person, place, and time. He appears well-developed and well-nourished. No distress.  HENT:  Head: Normocephalic and atraumatic.  Eyes: Conjunctivae are normal.  Neck: Neck supple.  Cardiovascular: Normal rate, regular rhythm and normal heart sounds.   Pulmonary/Chest: Effort normal. No respiratory distress. He has no wheezes. He has no rales.  Abdominal: Soft. Bowel sounds are normal. He exhibits no distension. There is tenderness. There is no rebound and no guarding.  epigastric tenderness  Musculoskeletal: He exhibits no edema.  Neurological: He is alert and oriented to person, place, and time.  Skin: Skin is warm and dry.  Nursing note and vitals reviewed.   ED Course  Procedures (including critical care time) Labs Review Labs Reviewed  COMPREHENSIVE METABOLIC PANEL - Abnormal; Notable for the following:    ALT 15 (*)    All other components within normal limits  CBC - Abnormal; Notable for the following:    RBC 6.25 (*)    MCV 67.7 (*)    MCH 21.4 (*)    RDW 16.1 (*)    All other components within normal limits  LIPASE, BLOOD    Imaging Review Dg Abd Acute W/chest  02/07/2015  CLINICAL DATA:  Patient with abdominal pain for 2- 3 months, worse on the left side. EXAM: DG ABDOMEN ACUTE W/ 1V CHEST COMPARISON:  CT abdomen 11/09/2013. FINDINGS: Stable cardiac mediastinal contours. No consolidative pulmonary opacities. No pleural effusion or pneumothorax. Gas is demonstrated within nondilated loops of large and small bowel in a nonobstructed pattern. No evidence for free intraperitoneal air, pneumatosis or portal venous gas. Regional skeleton is unremarkable. IMPRESSION: No acute cardiopulmonary process. Nonobstructed bowel gas pattern. Electronically Signed   By: Lovey Newcomer M.D.   On: 02/07/2015 18:49   I have personally reviewed and evaluated these images and lab results as part of my medical  decision-making.   EKG Interpretation None      MDM   Final diagnoses:  Epigastric pain    patient worsening epigastric pain for 3 months, worse at night, worse after eating. Reports some nausea and vomiting. Reports weight loss. Patient states he has been seen by his doctor and has been seen in the ER several times for this, had CT scan done which showed nonspecific stranding around left kidney, urinalysis unremarkable. Patient continues complaining of the pain so mother brought him in. Blood work obtained by triage, unremarkable, including negative LFTs and lipase. Will get acute abdomen to rule out free air. Will try GI cocktail and Carafate.  Patient feels much better after GI cocktail and Carafate. Acute abdomen does not show any free air. Patient is walking around, unable to provide [place] with urine analysis. I explained to him that I cannot check for any signs of kidney infection, patient voiced understanding. He wants to go home. We'll discharge him home with Carafate and Pepcid. I suspect he most likely has gastritis/ PUD. Will have him follow up with primary care doctor.  Filed Vitals:   02/07/15 1238 02/07/15 1248 02/07/15 1914  BP:  126/70 125/58  Pulse:  73 58  Temp:  98.3 F (36.8 C)   TempSrc:  Oral  Resp:  18 16  SpO2: 98% 99% 100%     Jeannett Senior, PA-C 02/08/15 [place], DO 02/08/15 2347

## 2015-02-07 NOTE — Discharge Instructions (Signed)
pepcid as prescribed for abdominal pain, take it daily! Take carafate as prescribed as well. See recommendations below. Return if worsening.   Gastroesophageal Reflux Disease, Adult Normally, food travels down the esophagus and stays in the stomach to be digested. However, when a person has gastroesophageal reflux disease (GERD), food and stomach acid move back up into the esophagus. When this happens, the esophagus becomes sore and inflamed. Over time, GERD can create small holes (ulcers) in the lining of the esophagus.  CAUSES This condition is caused by a problem with the muscle between the esophagus and the stomach (lower esophageal sphincter, or LES). Normally, the LES muscle closes after food passes through the esophagus to the stomach. When the LES is weakened or abnormal, it does not close properly, and that allows food and stomach acid to go back up into the esophagus. The LES can be weakened by certain dietary substances, medicines, and medical conditions, including:  Tobacco use.  Pregnancy.  Having a hiatal hernia.  Heavy alcohol use.  Certain foods and beverages, such as coffee, chocolate, onions, and peppermint. RISK FACTORS This condition is more likely to develop in:  People who have an increased body weight.  People who have connective tissue disorders.  People who use NSAID medicines. SYMPTOMS Symptoms of this condition include:  Heartburn.  Difficult or painful swallowing.  The feeling of having a lump in the throat.  Abitter taste in the mouth.  Bad breath.  Having a large amount of saliva.  Having an upset or bloated stomach.  Belching.  Chest pain.  Shortness of breath or wheezing.  Ongoing (chronic) cough or a night-time cough.  Wearing away of tooth enamel.  Weight loss. Different conditions can cause chest pain. Make sure to see your health care provider if you experience chest pain. DIAGNOSIS Your health care provider will take a  medical history and perform a physical exam. To determine if you have mild or severe GERD, your health care provider may also monitor how you respond to treatment. You may also have other tests, including:  An endoscopy toexamine your stomach and esophagus with a small camera.  A test thatmeasures the acidity level in your esophagus.  A test thatmeasures how much pressure is on your esophagus.  A barium swallow or modified barium swallow to show the shape, size, and functioning of your esophagus. TREATMENT The goal of treatment is to help relieve your symptoms and to prevent complications. Treatment for this condition may vary depending on how severe your symptoms are. Your health care provider may recommend:  Changes to your diet.  Medicine.  Surgery. HOME CARE INSTRUCTIONS Diet  Follow a diet as recommended by your health care provider. This may involve avoiding foods and drinks such as:  Coffee and tea (with or without caffeine).  Drinks that containalcohol.  Energy drinks and sports drinks.  Carbonated drinks or sodas.  Chocolate and cocoa.  Peppermint and mint flavorings.  Garlic and onions.  Horseradish.  Spicy and acidic foods, including peppers, chili powder, curry powder, vinegar, hot sauces, and barbecue sauce.  Citrus fruit juices and citrus fruits, such as oranges, lemons, and limes.  Tomato-based foods, such as red sauce, chili, salsa, and pizza with red sauce.  Fried and fatty foods, such as donuts, french fries, potato chips, and high-fat dressings.  High-fat meats, such as hot dogs and fatty cuts of red and white meats, such as rib eye steak, sausage, ham, and bacon.  High-fat dairy items, such as whole  milk, butter, and cream cheese.  Eat small, frequent meals instead of large meals.  Avoid drinking large amounts of liquid with your meals.  Avoid eating meals during the 2-3 hours before bedtime.  Avoid lying down right after you eat.  Do  not exercise right after you eat. General Instructions  Pay attention to any changes in your symptoms.  Take over-the-counter and prescription medicines only as told by your health care provider. Do not take aspirin, ibuprofen, or other NSAIDs unless your health care provider told you to do so.  Do not use any tobacco products, including cigarettes, chewing tobacco, and e-cigarettes. If you need help quitting, ask your health care provider.  Wear loose-fitting clothing. Do not wear anything tight around your waist that causes pressure on your abdomen.  Raise (elevate) the head of your bed 6 inches (15cm).  Try to reduce your stress, such as with yoga or meditation. If you need help reducing stress, ask your health care provider.  If you are overweight, reduce your weight to an amount that is healthy for you. Ask your health care provider for guidance about a safe weight loss goal.  Keep all follow-up visits as told by your health care provider. This is important. SEEK MEDICAL CARE IF:  You have new symptoms.  You have unexplained weight loss.  You have difficulty swallowing, or it hurts to swallow.  You have wheezing or a persistent cough.  Your symptoms do not improve with treatment.  You have a hoarse voice. SEEK IMMEDIATE MEDICAL CARE IF:  You have pain in your arms, neck, jaw, teeth, or back.  You feel sweaty, dizzy, or light-headed.  You have chest pain or shortness of breath.  You vomit and your vomit looks like blood or coffee grounds.  You faint.  Your stool is bloody or black.  You cannot swallow, drink, or eat.   This information is not intended to replace advice given to you by your health care provider. Make sure you discuss any questions you have with your health care provider.   Document Released: 10/10/2004 Document Revised: 09/21/2014 Document Reviewed: 04/27/2014 Elsevier Interactive Patient Education 2016 [place] for  Gastroesophageal Reflux Disease, Adult When you have gastroesophageal reflux disease (GERD), the foods you eat and your eating habits are very important. Choosing the right foods can help ease the discomfort of GERD. WHAT GENERAL GUIDELINES DO I NEED TO FOLLOW?  Choose fruits, vegetables, whole grains, low-fat dairy products, and low-fat meat, fish, and poultry.  Limit fats such as oils, salad dressings, butter, nuts, and avocado.  Keep a food diary to identify foods that cause symptoms.  Avoid foods that cause reflux. These may be different for different people.  Eat frequent small meals instead of three large meals each day.  Eat your meals slowly, in a relaxed setting.  Limit fried foods.  Cook foods using methods other than frying.  Avoid drinking alcohol.  Avoid drinking large amounts of liquids with your meals.  Avoid bending over or lying down until 2-3 hours after eating. WHAT FOODS ARE NOT RECOMMENDED? The following are some foods and drinks that may worsen your symptoms: Vegetables Tomatoes. Tomato juice. Tomato and spaghetti sauce. Chili peppers. Onion and garlic. Horseradish. Fruits Oranges, grapefruit, and lemon (fruit and juice). Meats High-fat meats, fish, and poultry. This includes hot dogs, ribs, ham, sausage, salami, and bacon. Dairy Whole milk and chocolate milk. Sour cream. Cream. Butter. Ice cream. Cream cheese.  Beverages Coffee and  tea, with or without caffeine. Carbonated beverages or energy drinks. Condiments Hot sauce. Barbecue sauce.  Sweets/Desserts Chocolate and cocoa. Donuts. Peppermint and spearmint. Fats and Oils High-fat foods, including [place] fries and potato chips. Other Vinegar. Strong spices, such as black pepper, white pepper, red pepper, cayenne, curry powder, cloves, ginger, and chili powder. The items listed above may not be a complete list of foods and beverages to avoid. Contact your dietitian for more information.   This  information is not intended to replace advice given to you by your health care provider. Make sure you discuss any questions you have with your health care provider.   Document Released: 12/31/2004 Document Revised: 01/21/2014 Document Reviewed: 11/04/2012 Elsevier Interactive Patient Education [employer].

## 2015-04-26 ENCOUNTER — Encounter: Payer: Self-pay | Admitting: Podiatry

## 2015-04-26 ENCOUNTER — Ambulatory Visit (INDEPENDENT_AMBULATORY_CARE_PROVIDER_SITE_OTHER): Payer: Medicaid Other | Admitting: Podiatry

## 2015-04-26 VITALS — BP 113/63 | HR 77 | Resp 12

## 2015-04-26 DIAGNOSIS — Q828 Other specified congenital malformations of skin: Secondary | ICD-10-CM

## 2015-04-26 NOTE — Patient Instructions (Signed)
your foot examination revealed multiple thickened skin on the bottom of your feet,calluses  that will continue to grow. Today I trim these calluses on right and left feet and return as needed  Corns and Calluses Corns are small areas of thickened skin that occur on the top, sides, or tip of a toe. They contain a cone-shaped core with a point that can press on a nerve below. This causes pain. Calluses are areas of thickened skin that can occur anywhere on the body including hands, fingers, palms, soles of the feet, and heels.Calluses are usually larger than corns.  CAUSES  Corns and calluses are caused by rubbing (friction) or pressure, such as from shoes that are too tight or do not fit properly.  RISK FACTORS Corns are more likely to develop in people who have toe deformities, such as hammer toes. Since calluses can occur with friction to any area of the skin, calluses are more likely to develop in people who:   Work with their hands.  Wear shoes that fit poorly, shoes that are too tight, or shoes that are high-heeled.  Have toes deformities. SYMPTOMS Symptoms of a corn or callus include:  A hard growth on the skin.   Pain or tenderness under the skin.   Redness and swelling.   Increased discomfort while wearing tight-fitting shoes. DIAGNOSIS  Corns and calluses may be diagnosed with a medical history and physical exam.  TREATMENT  Corns and calluses may be treated with:  Removing the cause of the friction or pressure. This may include:  Changing your shoes.  Wearing shoe inserts (orthotics) or other protective layers in your shoes, such as a corn pad.  Wearing gloves.  Medicines to help soften skin in the hardened, thickened areas.  Reducing the size of the corn or callus by removing the dead layers of skin.  Antibiotic medicines to treat infection.  Surgery, if a toe deformity is the cause. HOME CARE INSTRUCTIONS   Take medicines only as directed by your health  care provider.  If you were prescribed an antibiotic, finish all of it even if you start to feel better.  Wear shoes that fit well. Avoid wearing high-heeled shoes and shoes that are too tight or too loose.  Wear any padding, protective layers, gloves, or orthotics as directed by your health care provider.  Soak your hands or feet and then use a file or pumice stone to soften your corn or callus. Do this as directed by your health care provider.  Check your corn or callus every day for signs of infection. Watch for:  Redness, swelling, or pain.  Fluid, blood, or pus. SEEK MEDICAL CARE IF:   Your symptoms do not improve with treatment.  You have increased redness, swelling, or pain at the site of your corn or callus.  You have fluid, blood, or pus coming from your corn or callus.  You have new symptoms.   This information is not intended to replace advice given to you by your health care provider. Make sure you discuss any questions you have with your health care provider.   Document Released: 10/07/2003 Document Revised: 05/17/2014 Document Reviewed: 12/27/2013 Elsevier Interactive Patient Education [employer].

## 2015-04-26 NOTE — Progress Notes (Signed)
   Subjective:    Patient ID: Andre Padilla, male    DOB: [birth year], 30 y.o.   MRN: [id number]  HPI    This patient presents today with his sister and niece present in the treatment room. His sister is speaking for for the patient. She states that he's had at least 3 years of ongoing painful callouses on his right and left feet and gradually become more uncomfortable with shoe wearing walking over time. He has had bilateral foot surgery to resolve the symptoms, however, the multiple skin lesions persisted after surgery. 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   Review of Systems  Musculoskeletal: Positive for gait problem.  Skin: Positive for color change.       Objective:   Physical Exam  Patient appears pleasant, however, he has difficulty responding to direct questioning  Vascular: No peripheral edema bilaterally DP and PT pulses 2/4 bilaterally Capillary reflex immediate bilaterally  Neurological: Knee-ankle-foot reflex equal and reactive bilaterally  Dermatological: Well-healed surgical scars dorsal right hallux second right toe fifth MPJ right Well-healed surgical scars dorsal medial left first MPJ second toe left Multiple nucleated keratoses plantar right hallux first and fifth MPJ right Multiple nucleated keratoses plantar left hallux first MPJ left  Musculoskeletal: Patient has stable gait Residual hammertoe second left      Assessment & Plan:   Assessment: Chronic porokeratosis/callouses 3 right 2 left  Plan: Today I advised patient and i and his sister that these lesions were chronic and I recommended periodic sharp debridement as needed. Patient systems verbally consents for patient The plantar calluses 54 debrided without any bleeding  Reappoint at patient's  request

## 2016-02-23 ENCOUNTER — Encounter (HOSPITAL_COMMUNITY): Payer: Self-pay

## 2016-02-23 ENCOUNTER — Emergency Department (HOSPITAL_COMMUNITY): Payer: Medicaid Other

## 2016-02-23 ENCOUNTER — Emergency Department (HOSPITAL_COMMUNITY)
Admission: EM | Admit: 2016-02-23 | Discharge: 2016-02-24 | Disposition: A | Discharge status: Home / Self Care | Payer: Medicaid Other | Attending: Emergency Medicine | Admitting: Emergency Medicine

## 2016-02-23 DIAGNOSIS — F17210 Nicotine dependence, cigarettes, uncomplicated: Secondary | ICD-10-CM | POA: Insufficient documentation

## 2016-02-23 DIAGNOSIS — R05 Cough: Secondary | ICD-10-CM | POA: Diagnosis not present

## 2016-02-23 DIAGNOSIS — Z79899 Other long term (current) drug therapy: Secondary | ICD-10-CM | POA: Diagnosis not present

## 2016-02-23 DIAGNOSIS — R059 Cough, unspecified: Secondary | ICD-10-CM

## 2016-02-23 DIAGNOSIS — R1012 Left upper quadrant pain: Secondary | ICD-10-CM

## 2016-02-23 DIAGNOSIS — R1084 Generalized abdominal pain: Secondary | ICD-10-CM | POA: Diagnosis present

## 2016-02-23 LAB — CBC
HEMATOCRIT: 42.2 % (ref 39.0–52.0)
HEMOGLOBIN: 14 g/dL (ref 13.0–17.0)
MCH: 21.3 pg — AB (ref 26.0–34.0)
MCHC: 33.2 g/dL (ref 30.0–36.0)
MCV: 64.1 fL — ABNORMAL LOW (ref 78.0–100.0)
Platelets: 295 10*3/uL (ref 150–400)
RBC: 6.58 MIL/uL — AB (ref 4.22–5.81)
RDW: 16.3 % — ABNORMAL HIGH (ref 11.5–15.5)
WBC: 11 10*3/uL — AB (ref 4.0–10.5)

## 2016-02-23 LAB — COMPREHENSIVE METABOLIC PANEL
ALT: 27 U/L (ref 17–63)
AST: 30 U/L (ref 15–41)
Albumin: 4.9 g/dL (ref 3.5–5.0)
Alkaline Phosphatase: 63 U/L (ref 38–126)
Anion gap: 9 (ref 5–15)
BUN: 13 mg/dL (ref 6–20)
CO2: 24 mmol/L (ref 22–32)
Calcium: 9.2 mg/dL (ref 8.9–10.3)
Chloride: 105 mmol/L (ref 101–111)
Creatinine, Ser: 0.96 mg/dL (ref 0.61–1.24)
Glucose, Bld: 82 mg/dL (ref 65–99)
POTASSIUM: 4.2 mmol/L (ref 3.5–5.1)
SODIUM: 138 mmol/L (ref 135–145)
Total Bilirubin: 0.8 mg/dL (ref 0.3–1.2)
Total Protein: 8.1 g/dL (ref 6.5–8.1)

## 2016-02-23 LAB — URINALYSIS, ROUTINE W REFLEX MICROSCOPIC
BACTERIA UA: NONE SEEN
BILIRUBIN URINE: NEGATIVE
Glucose, UA: NEGATIVE mg/dL
KETONES UR: NEGATIVE mg/dL
LEUKOCYTES UA: NEGATIVE
Nitrite: NEGATIVE
PH: 6 (ref 5.0–8.0)
PROTEIN: NEGATIVE mg/dL
Specific Gravity, Urine: 1.018 (ref 1.005–1.030)

## 2016-02-23 LAB — LIPASE, BLOOD: LIPASE: 22 U/L (ref 11–51)

## 2016-02-23 LAB — I-STAT TROPONIN, ED: TROPONIN I, POC: 0 ng/mL (ref 0.00–0.08)

## 2016-02-23 NOTE — ED Triage Notes (Signed)
PT C/O GENERALIZED ABDOMINAL PAIN THAT STARTED 2 YEARS AGO ON AND OFF WITH N/V/D. PT ALSO C/O CHEST PAIN WITH COUGH AND FEVER X3-4 DAYS.

## 2016-02-23 NOTE — ED Provider Notes (Signed)
[facility] DEPT Provider Note   CSN: [id number] Arrival date & time: 02/23/16  2011  By signing my name below, I, Jeanell Sparrow, attest that this documentation has been prepared under the direction and in the presence of non-physician practitioner, Antonietta Breach, PA-C. Electronically Signed: Jeanell Sparrow, Scribe. 02/23/2016. 11:54 PM.   History   Chief Complaint Chief Complaint  Patient presents with  . Abdominal Pain  . Cough    The history is provided by the patient and a relative. No language interpreter was used.   HPI Comments: Andre Padilla is a 31 y.o. male who presents to the Emergency Department complaining of blood in stool that started yesterday. He states he has been having generalized abdominal pain and weight loss since a year ago. During then, he was discharged with GERD medication with some relief but has currently ran out. He reports associated decreased appetite, lightheadedness, chest pain, and diarrhea. He took ibuprofen PTA without relief. His last BM was earlier today. He denies any alcohol/substance use, sick contacts, hx of abdominal surgeries, vomiting, dysuria, hematuria, or other complaints.   PCP: [facility]   History reviewed. No pertinent past medical history.  There are no active problems to display for this patient.   Past Surgical History:  Procedure Laterality Date  . FOOT SURGERY       Home Medications    Prior to Admission medications   Medication Sig Start Date End Date Taking? Authorizing Provider  ibuprofen (ADVIL,MOTRIN) 200 MG tablet Take 400 mg by mouth every 6 (six) hours as needed for headache, mild pain or moderate pain.   Yes Historical Provider, MD  benzonatate (TESSALON) 100 MG capsule Take 1-2 capsules (100-200 mg total) by mouth every 8 (eight) hours. 02/24/16   Antonietta Breach, PA-C  dicyclomine (BENTYL) 20 MG tablet Take 1 tablet (20 mg total) by mouth 2 (two) times daily. For abdominal pain/cramping 02/24/16   Antonietta Breach, PA-C  famotidine (PEPCID) 20 MG tablet Take 1 tablet (20 mg total) by mouth 2 (two) times daily. 02/24/16   Antonietta Breach, PA-C  sucralfate (CARAFATE) 1 g tablet Take 1 tablet (1 g total) by mouth 4 (four) times daily -  with meals and at bedtime. 02/24/16   Antonietta Breach, PA-C    Family History History reviewed. No pertinent family history.  Social History Social History  Substance Use Topics  . Smoking status: Current Every Day Smoker    Packs/day: 0.15    Types: Cigarettes  . Smokeless tobacco: Never Used  . Alcohol use No     Allergies   Patient has no known allergies.   Review of Systems Review of Systems A complete 10 system review of systems was obtained and all systems are negative except as noted in the HPI and PMH.    Physical Exam Updated Vital Signs BP 122/73 (BP Location: Left Arm)   Pulse 105   Temp 98.4 F (36.9 C) (Oral)   Resp 16   Ht 5\' 11"  (1.803 m)   Wt 185 lb 4.8 oz (84.1 kg)   SpO2 100%   BMI 25.84 kg/m   Physical Exam  Constitutional: He is oriented to person, place, and time. He appears well-developed and well-nourished. No distress.  Nontoxic appearing and in NAD  HENT:  Head: Normocephalic and atraumatic.  Eyes: Conjunctivae and EOM are normal. No scleral icterus.  Neck: Normal range of motion.  Cardiovascular: Normal rate, regular rhythm and intact distal pulses.   Pulmonary/Chest: Effort normal.  No respiratory distress. He has no wheezes. He has no rales.  Respirations even and unlabored. Lungs CTAB.  Abdominal: Soft. He exhibits no distension and no mass. There is tenderness. There is guarding.  Soft, nondistended abdomen. There is tenderness in the left upper and left lower quadrants. Mild voluntary guarding. No masses or peritoneal signs.  Musculoskeletal: Normal range of motion.  Neurological: He is alert and oriented to person, place, and time. He exhibits normal muscle tone. Coordination normal.  GCS 15. Patient moving all  extremities.  Skin: Skin is warm and dry. No rash noted. He is not diaphoretic. No erythema. No pallor.  Psychiatric: He has a normal mood and affect. His behavior is normal.  Nursing note and vitals reviewed.    ED Treatments / Results  DIAGNOSTIC STUDIES: Oxygen Saturation is 100% on RA, normal by my interpretation.    COORDINATION OF CARE: 11:59 PM- Pt advised of plan for treatment and pt agrees.  Labs (all labs ordered are listed, but only abnormal results are displayed) Labs Reviewed  CBC - Abnormal; Notable for the following:       Result Value   WBC 11.0 (*)    RBC 6.58 (*)    MCV 64.1 (*)    MCH 21.3 (*)    RDW 16.3 (*)    All other components within normal limits  URINALYSIS, ROUTINE W REFLEX MICROSCOPIC - Abnormal; Notable for the following:    Hgb urine dipstick SMALL (*)    Squamous Epithelial / LPF 0-5 (*)    All other components within normal limits  LIPASE, BLOOD  COMPREHENSIVE METABOLIC PANEL  I-STAT TROPOININ, ED    EKG  EKG Interpretation  Date/Time:  Friday February 23 2016 20:43:49 EST Ventricular Rate:  106 PR Interval:    QRS Duration: 87 QT Interval:  294 QTC Calculation: 391 R Axis:   82 Text Interpretation:  Sinus tachycardia Borderline repolarization abnormality Confirmed by [facility]  MD, APRIL ([id number]) on 02/24/2016 2:09:17 AM       Radiology Dg Chest 2 View  Result Date: 02/23/2016 CLINICAL DATA:  Generalized abdominal pain, chest pain and headache. Cough and fever times 3-4 days. Beginning oral EXAM: CHEST  2 VIEW COMPARISON:  02/07/2015 FINDINGS: The heart size and mediastinal contours are within normal limits. Both lungs are clear. The visualized skeletal structures are unremarkable. IMPRESSION: No active cardiopulmonary disease. Electronically Signed   By: Ashley Royalty M.D.   On: 02/23/2016 20:57   Ct Abdomen Pelvis W Contrast  Result Date: 02/24/2016 CLINICAL DATA:  31 y/o  M; generalized abdominal pain. EXAM: CT ABDOMEN AND  PELVIS WITH CONTRAST TECHNIQUE: Multidetector CT imaging of the abdomen and pelvis was performed using the standard protocol following bolus administration of intravenous contrast. CONTRAST:  17mL ISOVUE-300 IOPAMIDOL (ISOVUE-300) INJECTION 61% COMPARISON:  11/09/2013 CT of the abdomen and pelvis. FINDINGS: Lower chest: No acute abnormality. Hepatobiliary: No focal liver abnormality is seen. No gallstones, gallbladder wall thickening, or biliary dilatation. Pancreas: Unremarkable. No pancreatic ductal dilatation or surrounding inflammatory changes. Spleen: Normal in size without focal abnormality. Adrenals/Urinary Tract: Adrenal glands are unremarkable. Kidneys are normal, without renal calculi, focal lesion, or hydronephrosis. Bladder is unremarkable. Stomach/Bowel: Stomach is within normal limits. Appendix appears normal. No evidence of bowel wall thickening, distention, or inflammatory changes. 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 No evidence of volvulus or bowel obstruction at this time. Vascular/Lymphatic: No significant vascular findings are present. No enlarged abdominal or pelvic lymph nodes. Reproductive: Prostate is unremarkable. Other: No abdominal wall hernia or abnormality. No abdominopelvic ascites. Musculoskeletal: No acute or significant osseous findings. IMPRESSION: 1. No acute abnormality identified as explanation of abdominal pain. 2. 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 No evidence for volvulus or obstruction at this time. Electronically Signed   By: Kristine Garbe M.D.   On: 02/24/2016 03:18    Procedures Procedures (including critical care time)  Medications Ordered in ED Medications  iopamidol (ISOVUE-300) 61 % injection (not administered)  sodium chloride 0.9 % bolus 1,000 mL (0 mLs Intravenous Stopped 02/24/16 0135)  ketorolac (TORADOL) 30 MG/ML injection 30 mg (30 mg Intravenous Given 02/24/16 0030)  ondansetron (ZOFRAN) injection 4 mg (4 mg Intravenous Given 02/24/16 0031)  benzonatate (TESSALON) capsule 100 mg (100 mg Oral Given 02/24/16 0030)  gi cocktail (Maalox,Lidocaine,Donnatal) (30 mLs Oral Given 02/24/16 0030)  dicyclomine (BENTYL) injection 20 mg (20 mg Intramuscular Given 02/24/16 0329)  sodium chloride 0.9 % bolus 1,000 mL (0 mLs Intravenous Stopped 02/24/16 0423)  iopamidol (ISOVUE-300) 61 % injection 100 mL (100 mLs Intravenous Contrast Given 02/24/16 0249)  oxyCODONE-acetaminophen (PERCOCET/ROXICET) 5-325 MG per tablet 1 tablet (1 tablet Oral Given 02/24/16 0359)     Initial Impression / Assessment and Plan / ED Course  I have reviewed the triage vital signs and the nursing notes.  Pertinent labs & imaging results that were available during my care of the patient were reviewed by me and considered in my medical decision making (see chart for details).     2:03 AM Patient reassessed. No change in abdominal exam. Patient still rates pain at 5/10. Will obtain CT. Anticipate discharge if negative given reassuring labs and symptoms chronicity.  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 No acute obstruction or volvulus. Patient appears calm and in NAD. No audible or visible signs of discomfort. Suspect ileus secondary to viral illness given URI symptoms and cough. Will manage supportively. Return precautions discussed and provided. Patient discharged in stable condition with no unaddressed concerns.   Final Clinical Impressions(s) / ED Diagnoses   Final diagnoses:  Left upper quadrant pain  Cough    New  Prescriptions Discharge Medication List as of 02/24/2016  3:43 AM    START taking these medications   Details  benzonatate (TESSALON) 100 MG capsule Take 1-2 capsules (100-200 mg total) by mouth every 8 (eight) hours., Starting Sat 02/24/2016, Print    dicyclomine (BENTYL) 20 MG tablet Take 1 tablet (20 mg total) by mouth 2 (two) times daily. For abdominal pain/cramping, Starting Sat 02/24/2016, Print       I personally performed the services described in this documentation, which was scribed in my presence. The recorded information has been reviewed and is accurate.       Antonietta Breach, PA-C 02/24/16 [id number]    April Palumbo, MD 02/24/16 [phone]

## 2016-02-24 ENCOUNTER — Encounter (HOSPITAL_COMMUNITY): Payer: Self-pay | Admitting: Radiology

## 2016-02-24 ENCOUNTER — Emergency Department (HOSPITAL_COMMUNITY): Payer: Medicaid Other

## 2016-02-24 MED ORDER — BENZONATATE 100 MG PO CAPS: 100.0000 mg | ORAL_CAPSULE | Freq: Three times a day (TID) | ORAL | 0 refills | Status: DC

## 2016-02-24 MED ORDER — SODIUM CHLORIDE 0.9 % IV BOLUS (SEPSIS)
1000.0000 mL | Freq: Once | INTRAVENOUS | Status: AC
  Administered 2016-02-24: 1000 mL via INTRAVENOUS

## 2016-02-24 MED ORDER — IOPAMIDOL (ISOVUE-300) INJECTION 61%
100.0000 mL | Freq: Once | INTRAVENOUS | Status: AC | PRN
  Administered 2016-02-24: 100 mL via INTRAVENOUS

## 2016-02-24 MED ORDER — KETOROLAC TROMETHAMINE 30 MG/ML IJ SOLN
30.0000 mg | Freq: Once | INTRAMUSCULAR | Status: AC
  Administered 2016-02-24: 30 mg via INTRAVENOUS
  Filled 2016-02-24: qty 1

## 2016-02-24 MED ORDER — ONDANSETRON HCL 4 MG/2ML IJ SOLN
4.0000 mg | Freq: Once | INTRAMUSCULAR | Status: AC
  Administered 2016-02-24: 4 mg via INTRAVENOUS
  Filled 2016-02-24: qty 2

## 2016-02-24 MED ORDER — GI COCKTAIL ~~LOC~~
30.0000 mL | Freq: Once | ORAL | Status: AC
  Administered 2016-02-24: 30 mL via ORAL
  Filled 2016-02-24: qty 30

## 2016-02-24 MED ORDER — IOPAMIDOL (ISOVUE-300) INJECTION 61%
INTRAVENOUS | Status: AC
  Filled 2016-02-24: qty 100

## 2016-02-24 MED ORDER — BENZONATATE 100 MG PO CAPS
100.0000 mg | ORAL_CAPSULE | Freq: Once | ORAL | Status: AC
  Administered 2016-02-24: 100 mg via ORAL
  Filled 2016-02-24: qty 1

## 2016-02-24 MED ORDER — SUCRALFATE 1 G PO TABS: 1.0000 g | ORAL_TABLET | Freq: Three times a day (TID) | ORAL | 1 refills | Status: DC

## 2016-02-24 MED ORDER — DICYCLOMINE HCL 20 MG PO TABS: 20.0000 mg | ORAL_TABLET | Freq: Two times a day (BID) | ORAL | 0 refills | Status: DC

## 2016-02-24 MED ORDER — OXYCODONE-ACETAMINOPHEN 5-325 MG PO TABS
1.0000 | ORAL_TABLET | Freq: Once | ORAL | Status: AC
  Administered 2016-02-24: 1 via ORAL
  Filled 2016-02-24: qty 1

## 2016-02-24 MED ORDER — FAMOTIDINE 20 MG PO TABS: 20.0000 mg | ORAL_TABLET | Freq: Two times a day (BID) | ORAL | 1 refills | Status: DC

## 2016-02-24 MED ORDER — DICYCLOMINE HCL 10 MG/ML IM SOLN
20.0000 mg | Freq: Once | INTRAMUSCULAR | Status: AC
  Administered 2016-02-24: 20 mg via INTRAMUSCULAR
  Filled 2016-02-24: qty 2

## 2016-05-07 ENCOUNTER — Emergency Department (HOSPITAL_COMMUNITY)
Admission: EM | Admit: 2016-05-07 | Discharge: 2016-05-07 | Disposition: A | Discharge status: Home / Self Care | Payer: Medicaid Other | Attending: Emergency Medicine | Admitting: Emergency Medicine

## 2016-05-07 ENCOUNTER — Encounter (HOSPITAL_COMMUNITY): Payer: Self-pay | Admitting: Emergency Medicine

## 2016-05-07 ENCOUNTER — Other Ambulatory Visit: Payer: Self-pay

## 2016-05-07 ENCOUNTER — Emergency Department (HOSPITAL_COMMUNITY): Payer: Medicaid Other

## 2016-05-07 DIAGNOSIS — F17210 Nicotine dependence, cigarettes, uncomplicated: Secondary | ICD-10-CM | POA: Insufficient documentation

## 2016-05-07 DIAGNOSIS — R0789 Other chest pain: Secondary | ICD-10-CM | POA: Diagnosis not present

## 2016-05-07 DIAGNOSIS — Z79899 Other long term (current) drug therapy: Secondary | ICD-10-CM | POA: Diagnosis not present

## 2016-05-07 DIAGNOSIS — R079 Chest pain, unspecified: Secondary | ICD-10-CM | POA: Diagnosis present

## 2016-05-07 LAB — BASIC METABOLIC PANEL
ANION GAP: 12 (ref 5–15)
BUN: 9 mg/dL (ref 6–20)
CO2: 22 mmol/L (ref 22–32)
CREATININE: 0.8 mg/dL (ref 0.61–1.24)
Calcium: 9.3 mg/dL (ref 8.9–10.3)
Chloride: 105 mmol/L (ref 101–111)
GFR calc non Af Amer: >60 mL/min (ref >60)
Glucose, Bld: 85 mg/dL (ref 65–99)
POTASSIUM: 4 mmol/L (ref 3.5–5.1)
Sodium: 139 mmol/L (ref 135–145)

## 2016-05-07 LAB — CBC
HCT: 44.8 % (ref 39.0–52.0)
Hemoglobin: 15.1 g/dL (ref 13.0–17.0)
MCH: 22 pg — ABNORMAL LOW (ref 26.0–34.0)
MCHC: 33.7 g/dL (ref 30.0–36.0)
MCV: 65.4 fL — ABNORMAL LOW (ref 78.0–100.0)
PLATELETS: ADEQUATE 10*3/uL (ref 150–400)
RBC: 6.85 MIL/uL — ABNORMAL HIGH (ref 4.22–5.81)
RDW: 16.7 % — AB (ref 11.5–15.5)
WBC: 10.5 10*3/uL (ref 4.0–10.5)

## 2016-05-07 LAB — I-STAT TROPONIN, ED: Troponin i, poc: 0 ng/mL (ref 0.00–0.08)

## 2016-05-07 NOTE — ED Triage Notes (Signed)
Pt reports for the last week he has had left sided sharp chest pains that go into left side of back worse when he lifting his left arm. No sob. Pt warm dry and in no distress.

## 2016-05-07 NOTE — ED Notes (Signed)
Patient reports pain in the left chest area that started over a week ago, first noticed while at rest watching tv. Pain has been intermittent. Pain is worse whenever he raises his left arm.

## 2016-05-07 NOTE — ED Provider Notes (Signed)
[facility] DEPT Provider Note   CSN: [id number] Arrival date & time: 05/07/16  1747     History   Chief Complaint Chief Complaint  Patient presents with  . Chest Pain    HPI Andre Padilla is a 31 y.o. male.  The history is provided by the patient.  Chest Pain   This is a new problem. The current episode started more than 1 week ago. The problem occurs constantly. The problem has not changed since onset.The pain is associated with movement. The pain is present in the lateral region (left). The pain is moderate. The quality of the pain is described as stabbing. The pain does not radiate. The symptoms are aggravated by certain positions. Pertinent negatives include no cough, no fever, no irregular heartbeat, no nausea, no shortness of breath and no vomiting. Treatments tried: massage. The treatment provided significant (but returned) relief.    History reviewed. No pertinent past medical history.  There are no active problems to display for this patient.   Past Surgical History:  Procedure Laterality Date  . FOOT SURGERY         Home Medications    Prior to Admission medications   Medication Sig Start Date End Date Taking? Authorizing Provider  benzonatate (TESSALON) 100 MG capsule Take 1-2 capsules (100-200 mg total) by mouth every 8 (eight) hours. 02/24/16   Antonietta Breach, PA-C  dicyclomine (BENTYL) 20 MG tablet Take 1 tablet (20 mg total) by mouth 2 (two) times daily. For abdominal pain/cramping 02/24/16   Antonietta Breach, PA-C  famotidine (PEPCID) 20 MG tablet Take 1 tablet (20 mg total) by mouth 2 (two) times daily. 02/24/16   Antonietta Breach, PA-C  ibuprofen (ADVIL,MOTRIN) 200 MG tablet Take 400 mg by mouth every 6 (six) hours as needed for headache, mild pain or moderate pain.    Historical Provider, MD  sucralfate (CARAFATE) 1 g tablet Take 1 tablet (1 g total) by mouth 4 (four) times daily -  with meals and at bedtime. 02/24/16   Antonietta Breach, PA-C    Family History No  family history on file.  Social History Social History  Substance Use Topics  . Smoking status: Current Every Day Smoker    Packs/day: 0.15    Types: Cigarettes  . Smokeless tobacco: Never Used  . Alcohol use No     Allergies   Patient has no known allergies.   Review of Systems Review of Systems  Constitutional: Negative for fever.  Respiratory: Negative for cough and shortness of breath.   Cardiovascular: Positive for chest pain.  Gastrointestinal: Negative for nausea and vomiting.   All other systems are reviewed and are negative for acute change except as noted in the HPI   Physical Exam Updated Vital Signs BP 129/70 (BP Location: Left Arm)   Pulse 69   Temp 98.7 F (37.1 C) (Oral)   Resp 16   Ht 5\' 11"  (1.803 m)   Wt 190 lb (86.2 kg)   SpO2 100%   BMI 26.50 kg/m   Physical Exam  Constitutional: He is oriented to person, place, and time. He appears well-developed and well-nourished. No distress.  HENT:  Head: Normocephalic and atraumatic.  Nose: Nose normal.  Eyes: Conjunctivae and EOM are normal. Pupils are equal, round, and reactive to light. Right eye exhibits no discharge. Left eye exhibits no discharge. No scleral icterus.  Neck: Normal range of motion. Neck supple.  Cardiovascular: Normal rate and regular rhythm.  Exam reveals no gallop and no  friction rub.   No murmur heard. Pulmonary/Chest: Effort normal and breath sounds normal. No stridor. No respiratory distress. He has no rales.     He exhibits tenderness.    Abdominal: Soft. He exhibits no distension. There is no tenderness.  Musculoskeletal: He exhibits no edema or tenderness.  Neurological: He is alert and oriented to person, place, and time.  Skin: Skin is warm and dry. No rash noted. He is not diaphoretic. No erythema.  Psychiatric: He has a normal mood and affect.  Vitals reviewed.    ED Treatments / Results  Labs (all labs ordered are listed, but only abnormal results are  displayed) Labs Reviewed  CBC - Abnormal; Notable for the following:       Result Value   RBC 6.85 (*)    MCV 65.4 (*)    MCH 22.0 (*)    RDW 16.7 (*)    All other components within normal limits  BASIC METABOLIC PANEL  I-STAT TROPOININ, ED    EKG  EKG Interpretation  Date/Time:  Tuesday May 07 2016 17:53:53 EDT Ventricular Rate:  100 PR Interval:  126 QRS Duration: 86 QT Interval:  342 QTC Calculation: 441 R Axis:   77 Text Interpretation:  Normal sinus rhythm ST & T wave abnormality, consider inferior ischemia Abnormal ECG No significant change since last tracing Confirmed by [facility] ([id number]) on 05/07/2016 8:23:38 PM       Radiology Dg Chest 2 View  Result Date: 05/07/2016 CLINICAL DATA:  Chest pain EXAM: CHEST  2 VIEW COMPARISON:  02/23/2016 chest radiograph. FINDINGS: Stable cardiomediastinal silhouette with normal heart size. No pneumothorax. No pleural effusion. Lungs appear clear, with no acute consolidative airspace disease and no pulmonary edema. IMPRESSION: No active cardiopulmonary disease. Electronically Signed   By: Ilona Sorrel M.D.   On: 05/07/2016 18:39    Procedures Procedures (including critical care time)  Medications Ordered in ED Medications - No data to display   Initial Impression / Assessment and Plan / ED Course  I have reviewed the triage vital signs and the nursing notes.  Pertinent labs & imaging results that were available during my care of the patient were reviewed by me and considered in my medical decision making (see chart for details).     Most consistent with chest wall pain. Highly inconsistent with ACS. EKG without acute ischemic changes or evidence of pericarditis. Labs obtained at triage revealed negative troponin.Marland Kitchen PERC negative. Low suspicion for pulmonary embolism. Presentation not classic for aortic dissection or esophageal perforation. Chest x-ray without evidence suggestive of pneumonia, pneumothorax,  pneumomediastinum.  No abnormal contour of the mediastinum to suggest dissection. No evidence of acute injuries.  Do not feel that additional workup is required at this time.  Symptomatic treatment discussed with the patient for MSK pain.  Final Clinical Impressions(s) / ED Diagnoses   Final diagnoses:  Chest wall pain   Disposition: Discharge  Condition: Good  I have discussed the results, Dx and Tx plan with the patient who expressed understanding and agree(s) with the plan. Discharge instructions discussed at great length. The patient was given strict return precautions who verbalized understanding of the instructions. No further questions at time of discharge.    Current Discharge Medication List      Follow Up: [place] [address] [phone]  Schedule an appointment as soon as possible for a visit  As needed      Fatima Blank, MD 05/07/16 2049

## 2016-10-07 ENCOUNTER — Emergency Department (HOSPITAL_COMMUNITY)
Admission: EM | Admit: 2016-10-07 | Discharge: 2016-10-07 | Disposition: A | Discharge status: Home / Self Care | Payer: Medicaid Other | Attending: Emergency Medicine | Admitting: Emergency Medicine

## 2016-10-07 ENCOUNTER — Encounter (HOSPITAL_COMMUNITY): Payer: Self-pay

## 2016-10-07 ENCOUNTER — Emergency Department (HOSPITAL_COMMUNITY): Payer: Medicaid Other

## 2016-10-07 DIAGNOSIS — Z79899 Other long term (current) drug therapy: Secondary | ICD-10-CM | POA: Diagnosis not present

## 2016-10-07 DIAGNOSIS — R112 Nausea with vomiting, unspecified: Secondary | ICD-10-CM | POA: Diagnosis present

## 2016-10-07 DIAGNOSIS — F17210 Nicotine dependence, cigarettes, uncomplicated: Secondary | ICD-10-CM | POA: Insufficient documentation

## 2016-10-07 DIAGNOSIS — R197 Diarrhea, unspecified: Secondary | ICD-10-CM | POA: Insufficient documentation

## 2016-10-07 DIAGNOSIS — R0789 Other chest pain: Secondary | ICD-10-CM | POA: Diagnosis not present

## 2016-10-07 DIAGNOSIS — R1013 Epigastric pain: Secondary | ICD-10-CM

## 2016-10-07 HISTORY — DX: Toxic effect of lead and its compounds, accidental (unintentional), initial encounter: T56.0X1A

## 2016-10-07 LAB — HEPATIC FUNCTION PANEL
ALBUMIN: 4.7 g/dL (ref 3.5–5.0)
ALK PHOS: 62 U/L (ref 38–126)
ALT: 45 U/L (ref 17–63)
AST: 38 U/L (ref 15–41)
BILIRUBIN TOTAL: 0.6 mg/dL (ref 0.3–1.2)
Bilirubin, Direct: <0.1 mg/dL — ABNORMAL LOW (ref 0.1–0.5)
TOTAL PROTEIN: 7.8 g/dL (ref 6.5–8.1)

## 2016-10-07 LAB — BASIC METABOLIC PANEL
Anion gap: 10 (ref 5–15)
BUN: 14 mg/dL (ref 6–20)
CHLORIDE: 103 mmol/L (ref 101–111)
CO2: 25 mmol/L (ref 22–32)
Calcium: 9.1 mg/dL (ref 8.9–10.3)
Creatinine, Ser: 0.97 mg/dL (ref 0.61–1.24)
GFR calc Af Amer: >60 mL/min (ref >60)
GFR calc non Af Amer: >60 mL/min (ref >60)
Glucose, Bld: 97 mg/dL (ref 65–99)
Potassium: 3.8 mmol/L (ref 3.5–5.1)
SODIUM: 138 mmol/L (ref 135–145)

## 2016-10-07 LAB — CBC
HEMATOCRIT: 42.2 % (ref 39.0–52.0)
Hemoglobin: 14.2 g/dL (ref 13.0–17.0)
MCH: 21.7 pg — ABNORMAL LOW (ref 26.0–34.0)
MCHC: 33.6 g/dL (ref 30.0–36.0)
MCV: 64.5 fL — AB (ref 78.0–100.0)
Platelets: 282 10*3/uL (ref 150–400)
RBC: 6.54 MIL/uL — AB (ref 4.22–5.81)
RDW: 16.4 % — AB (ref 11.5–15.5)
WBC: 9.8 10*3/uL (ref 4.0–10.5)

## 2016-10-07 LAB — LIPASE, BLOOD: Lipase: 41 U/L (ref 11–51)

## 2016-10-07 LAB — I-STAT TROPONIN, ED: Troponin i, poc: 0.01 ng/mL (ref 0.00–0.08)

## 2016-10-07 MED ORDER — OMEPRAZOLE 20 MG PO CPDR: 20.0000 mg | DELAYED_RELEASE_CAPSULE | Freq: Every day | ORAL | 0 refills | Status: DC

## 2016-10-07 MED ORDER — FAMOTIDINE 20 MG PO TABS: 20.0000 mg | ORAL_TABLET | Freq: Once | ORAL | Status: DC

## 2016-10-07 MED ORDER — SUCRALFATE 1 G PO TABS: 1.0000 g | ORAL_TABLET | Freq: Three times a day (TID) | ORAL | 0 refills | Status: AC

## 2016-10-07 MED ORDER — FAMOTIDINE 20 MG PO TABS: 20.0000 mg | ORAL_TABLET | Freq: Two times a day (BID) | ORAL | 0 refills | Status: AC

## 2016-10-07 MED ORDER — DICYCLOMINE HCL 20 MG PO TABS: 20.0000 mg | ORAL_TABLET | Freq: Two times a day (BID) | ORAL | 0 refills | Status: DC

## 2016-10-07 MED ORDER — DICYCLOMINE HCL 10 MG/ML IM SOLN
20.0000 mg | Freq: Once | INTRAMUSCULAR | Status: AC
  Administered 2016-10-07: 20 mg via INTRAMUSCULAR
  Filled 2016-10-07: qty 2

## 2016-10-07 MED ORDER — FAMOTIDINE 20 MG PO TABS
20.0000 mg | ORAL_TABLET | Freq: Once | ORAL | Status: AC
  Administered 2016-10-07: 20 mg via ORAL
  Filled 2016-10-07: qty 1

## 2016-10-07 MED ORDER — FAMOTIDINE IN NACL 20-0.9 MG/50ML-% IV SOLN
20.0000 mg | Freq: Once | INTRAVENOUS | Status: DC
  Filled 2016-10-07: qty 50

## 2016-10-07 MED ORDER — SODIUM CHLORIDE 0.9 % IV BOLUS (SEPSIS): 1000.0000 mL | Freq: Once | INTRAVENOUS | Status: DC

## 2016-10-07 MED ORDER — METOCLOPRAMIDE HCL 5 MG/ML IJ SOLN
10.0000 mg | Freq: Once | INTRAMUSCULAR | Status: DC
  Filled 2016-10-07: qty 2

## 2016-10-07 MED ORDER — GI COCKTAIL ~~LOC~~
30.0000 mL | Freq: Once | ORAL | Status: AC
  Administered 2016-10-07: 30 mL via ORAL
  Filled 2016-10-07: qty 30

## 2016-10-07 MED ORDER — ONDANSETRON 8 MG PO TBDP: 8.0000 mg | ORAL_TABLET | Freq: Once | ORAL | Status: DC

## 2016-10-07 NOTE — ED Notes (Signed)
Spoke with Dorothea Ogle, [place],  he would like to hold on starting an IV and performing CT scan. Informed Marita Kansas with CT about the hold of CT. If CT is needed, fast track staff will notify CT.

## 2016-10-07 NOTE — Discharge Instructions (Signed)
Your abdominal pain is likely from gastritis, reflux or a stomach ulcer. You will need to take the prescribed proton pump inhibitor as directed, and avoid spicy/fatty/acidic foods. Avoid laying down flat within 30 minutes of eating. Avoid NSAIDs like ibuprofen or Aleve on an empty stomach.  Follow up with the gastroenterologist (GI doctor) listed for ongoing evaluation of your abdominal pain. Return to the ER for new or worsening symptoms, any additional concers.   SEEK IMMEDIATE MEDICAL ATTENTION IF YOU DEVELOP ANY OF THE FOLLOWING SYMPTOMS: The pain does not go away or becomes severe.  A temperature above 101 develops.  Repeated vomiting occurs (multiple episodes).  Blood is being passed in stools or vomit (bright red or black tarry stools).  Return also if you develop chest pain, difficulty breathing, dizziness or fainting

## 2016-10-07 NOTE — ED Notes (Signed)
Pt's family member reports pt has had epigastric pain for a month, was able to talk him into coming to the ED today.  She reports pt started to having vomiting yesterday, and vomits each time he eats.  Denies diarrhea.  Every time this nurse asks questions to obtain health HPI, family member answers for him.

## 2016-10-07 NOTE — ED Triage Notes (Signed)
Patient c/o mid chest pain that radiates into the epigastric area x 1 week. Patient reports that he vomits after eating x 1 week.

## 2016-10-07 NOTE — ED Notes (Signed)
Bed: WTR7 Expected date:  Expected time:  Means of arrival:  Comments: 

## 2016-10-07 NOTE — ED Provider Notes (Signed)
[facility] DEPT Provider Note   CSN: [id number] Arrival date & time: 10/07/16  1229     History   Chief Complaint Chief Complaint  Patient presents with  . Chest Pain  . Emesis    HPI Andre Padilla is a 31 y.o. male.  HPI 31 year old African-American male presents to the emergency Department today with complaints of epigastric pain, chest pain, emesis. Patient states that his epigastric pain and emesis has been ongoing for 1 week intermittently. States it is worse with food. States that after he eats he throws up. Patient with history of same and was told he has acid reflux and peptic ulcer disease. Patient has been seen in the past and given GI cocktail and Carafate with relief in his symptoms. Patient has not followed up with GI doctor. Patient states that he did develop chest pain yesterday afternoon. Describes it as substernal. It does not radiate. Describes as air and sharp in nature. Intermittent. Last for several seconds then self resolves.. Not associated with diaphoresis, shortness of breath. Denies any hematemesis or hemoptysis. Denies any history of PE/DVT, prolonged immobilization, recent hospitalizations or surgeries, lower extremity edema or calf tenderness. Mother provides most of the history. Patient states that his pain has been ongoing for several months. States the pain is mainly at night. Worsened by eating. Patient also reports some intermittent diarrhea for the past week. Denies any blood in his stool. Denies any urinary symptoms. Denies any associated fevers, recent travel, antibiotic use. Denies any cardiac history is significant family cardiac history. Patient takes no meds regularly.  Pt denies any fever, chill, ha, vision changes, lightheadedness, dizziness, congestion, neck pain,sob, cough,, urinary symptoms,  melena, hematochezia, lower extremity paresthesias.  Past Medical History:  Diagnosis Date  . Lead poisoning     There are no active problems to  display for this patient.   Past Surgical History:  Procedure Laterality Date  . FOOT SURGERY         Home Medications    Prior to Admission medications   Medication Sig Start Date End Date Taking? Authorizing Provider  benzonatate (TESSALON) 100 MG capsule Take 1-2 capsules (100-200 mg total) by mouth every 8 (eight) hours. 02/24/16   Antonietta Breach, PA-C  dicyclomine (BENTYL) 20 MG tablet Take 1 tablet (20 mg total) by mouth 2 (two) times daily. For abdominal pain/cramping 02/24/16   Antonietta Breach, PA-C  famotidine (PEPCID) 20 MG tablet Take 1 tablet (20 mg total) by mouth 2 (two) times daily. 02/24/16   Antonietta Breach, PA-C  ibuprofen (ADVIL,MOTRIN) 200 MG tablet Take 400 mg by mouth every 6 (six) hours as needed for headache, mild pain or moderate pain.    [provider]  sucralfate (CARAFATE) 1 g tablet Take 1 tablet (1 g total) by mouth 4 (four) times daily -  with meals and at bedtime. 02/24/16   Antonietta Breach, PA-C    Family History Family History  Problem Relation Age of Onset  . Hypertension Mother   . Asthma Mother     Social History Social History  Substance Use Topics  . Smoking status: Current Every Day Smoker    Packs/day: 0.15    Types: Cigarettes  . Smokeless tobacco: Never Used  . Alcohol use No     Allergies   Patient has no known allergies.   Review of Systems Review of Systems  Constitutional: Negative for chills and fever.  HENT: Negative for congestion and sore throat.   Eyes: Negative for visual  disturbance.  Respiratory: Negative for cough and shortness of breath.   Cardiovascular: Positive for chest pain. Negative for palpitations and leg swelling.  Gastrointestinal: Positive for abdominal pain, diarrhea, nausea and vomiting. Negative for abdominal distention and blood in stool.  Genitourinary: Negative for dysuria, flank pain, frequency, hematuria, scrotal swelling, testicular pain and urgency.  Musculoskeletal: Negative for arthralgias  and myalgias.  Skin: Negative for rash.  Neurological: Negative for dizziness, syncope, weakness, light-headedness, numbness and headaches.  Psychiatric/Behavioral: Negative for sleep disturbance. The patient is not nervous/anxious.      Physical Exam Updated Vital Signs BP 130/84 (BP Location: Right Arm)   Pulse 65   Temp 98.9 F (37.2 C) (Oral)   Resp 16   Ht 5\' 11"  (1.803 m)   Wt 91.6 kg (202 lb)   SpO2 99%   BMI 28.17 kg/m   Physical Exam  Constitutional: He is oriented to person, place, and time. He appears well-developed and well-nourished.  Non-toxic appearance. No distress.  Laughing intermittently  HENT:  Head: Normocephalic and atraumatic.  Mouth/Throat: Oropharynx is clear and moist.  Eyes: Pupils are equal, round, and reactive to light. Conjunctivae are normal. Right eye exhibits no discharge. Left eye exhibits no discharge.  Neck: Normal range of motion. Neck supple. No JVD present. No tracheal deviation present.  Cardiovascular: Normal rate, regular rhythm, normal heart sounds and intact distal pulses.  Exam reveals no gallop and no friction rub.   No murmur heard. Pulmonary/Chest: Effort normal and breath sounds normal. No respiratory distress. He has no wheezes. He has no rales. He exhibits no tenderness.  No hypoxia or tachypnea.  Abdominal: Soft. He exhibits no distension. Bowel sounds are increased. There is tenderness in the epigastric area. There is no rigidity, no rebound, no guarding, no CVA tenderness, no tenderness at McBurney's point and negative Murphy's sign.  Mild epigastric tenderness. No right upper quadrant tenderness. No Murphy sign. No signs of peritonitis.  Musculoskeletal: Normal range of motion. He exhibits no tenderness.  No lower extremity edema or calf tenderness.  Lymphadenopathy:    He has no cervical adenopathy.  Neurological: He is alert and oriented to person, place, and time.  Skin: Skin is warm and dry. Capillary refill takes less  than 2 seconds. No rash noted.  Psychiatric: His behavior is normal. Judgment and thought content normal.  Nursing note and vitals reviewed.    ED Treatments / Results  Labs (all labs ordered are listed, but only abnormal results are displayed) Labs Reviewed  CBC - Abnormal; Notable for the following:       Result Value   RBC 6.54 (*)    MCV 64.5 (*)    MCH 21.7 (*)    RDW 16.4 (*)    All other components within normal limits  HEPATIC FUNCTION PANEL - Abnormal; Notable for the following:    Bilirubin, Direct <0.1 (*)    All other components within normal limits  BASIC METABOLIC PANEL  LIPASE, BLOOD  I-STAT TROPONIN, ED    EKG  EKG Interpretation  Date/Time:  Monday October 07 2016 12:34:06 EDT Ventricular Rate:  84 PR Interval:  144 QRS Duration: 90 QT Interval:  362 QTC Calculation: 427 R Axis:   82 Text Interpretation:  Normal sinus rhythm Normal ECG No significant change since last tracing Confirmed by Addison Lank [id number]) on 10/07/2016 12:53:06 PM Also confirmed by Addison Lank [phone]), editor Laurena Spies [phone])  on 10/07/2016 1:38:35 PM       Radiology Dg  Chest 2 View  Result Date: 10/07/2016 CLINICAL DATA:  Cough, shortness of breath, and abdominal pain for the past week associated with nausea and vomiting decreased oral intake. Current smoker. EXAM: CHEST  2 VIEW COMPARISON:  Chest x-ray of May 07, 2016 FINDINGS: The lungs are well-expanded. There is no focal infiltrate. The interstitial markings are coarse though stable. The heart and pulmonary vascularity are normal. The mediastinum is normal in width. The bony thorax exhibits no acute abnormality. IMPRESSION: There is no acute cardiopulmonary abnormality. Electronically Signed   By: David  Martinique M.D.   On: 10/07/2016 15:39    Procedures Procedures (including critical care time)  Medications Ordered in ED Medications  gi cocktail (Maalox,Lidocaine,Donnatal) (30 mLs Oral Given 10/07/16 2253)    famotidine (PEPCID) tablet 20 mg (20 mg Oral Given 10/07/16 2326)  dicyclomine (BENTYL) injection 20 mg (20 mg Intramuscular Given 10/07/16 2327)     Initial Impression / Assessment and Plan / ED Course  I have reviewed the triage vital signs and the nursing notes.  Pertinent labs & imaging results that were available during my care of the patient were reviewed by me and considered in my medical decision making (see chart for details).     Patient presents to the ED with complaints of epigastric abdominal pain, nausea, emesis, diarrhea and has been ongoing for several weeks however history of same that has been ongoing for several months. Patient seen in the ED for same in the past. Failed to follow-up with her GI doctor. States the pain is worse after eating.  Patient is overall well-appearing and nontoxic. Vital signs are reassuring. Patient is afebrile, no tachycardia, no tachypnea, no hypoxia.  Labwork is reassuring. No leukocytosis. Normal lipase, normal kidney function, normal electrolytes, normal liver function. I-STAT troponin is negative.  EKG shows normal sinus rhythm and no change from prior tracings. Chest x-ray shows no free air or cardiopulmonary pathology. The patient's chest pain seems very atypical for ACS. Heart pathway score of 1.  Patient is PERC negative. Low suspicion for PE. Patient's presentation does not seem consistent with aortic dissection or esophageal perforation. Patient's chest pain likely due  To possible gastritis versus PUD, GERD. Abdominal x-ray shows no obstruction. Patient did have a CT scan in February of this year for same symptoms. Noted chronic changes but no acute abnormalities were noted.  Patient with some mild abdominal epigastric tenderness however no signs of peritonitis or significant abdominal pain on palpation. No right upper quadrant pain or Murphy sign that would be concerning for cholecystitis. Lipase is normal. Doubt pancreatitis. Liver  function are normal. Doubt a liver pathology. Patient with history of same that is responded well to Carafate and GI cocktail the past.  Patient improved with GI cocktail and Pepcid, Bentyl. Discussed with mother and patient concerning further imaging of abdomen and pelvis and risk of radiation from CT scan. They are comfortable with trying symptomatic treatment at home. If symptoms persist or worsen encouraged him to return to the ED. Encouraged him to follow up with GI doctor. Patient may need endoscopy and colonoscopy in the outpatient setting.  Vital signs remained reassuring. Pain is improved with GI cocktail. Tolerating po fluids without emesis. The patient is safe and stable for discharge.  Mother is requesting pain medicine for pt. we'll prescribe Bentyl, Prilosec, Zantac, Carafate.  Pt is hemodynamically stable, in NAD, & able to ambulate in the ED. Evaluation does not show pathology that would require ongoing emergent intervention or inpatient treatment.  I explained the diagnosis to the patient. Pain has been managed & has no complaints prior to dc. Pt is comfortable with above plan and is stable for discharge at this time. All questions were answered prior to disposition. Strict return precautions for f/u to the ED were discussed. Encouraged follow up with PCP.      Final Clinical Impressions(s) / ED Diagnoses   Final diagnoses:  Nausea vomiting and diarrhea  Epigastric abdominal pain  Atypical chest pain    New Prescriptions New Prescriptions   DICYCLOMINE (BENTYL) 20 MG TABLET    Take 1 tablet (20 mg total) by mouth 2 (two) times daily.   OMEPRAZOLE (PRILOSEC) 20 MG CAPSULE    Take 1 capsule (20 mg total) by mouth daily.     Doristine Devoid, PA-C 10/07/16 2344    Isla Pence, MD 10/08/16 [phone]

## 2016-10-08 ENCOUNTER — Encounter: Payer: Self-pay | Admitting: Physician Assistant

## 2016-10-18 ENCOUNTER — Encounter: Payer: Self-pay | Admitting: Physician Assistant

## 2016-10-18 ENCOUNTER — Ambulatory Visit (INDEPENDENT_AMBULATORY_CARE_PROVIDER_SITE_OTHER): Payer: Medicaid Other | Admitting: Physician Assistant

## 2016-10-18 ENCOUNTER — Other Ambulatory Visit: Payer: Medicaid Other

## 2016-10-18 VITALS — BP 100/58 | HR 71 | Ht 71.0 in | Wt 201.0 lb

## 2016-10-18 DIAGNOSIS — R1084 Generalized abdominal pain: Secondary | ICD-10-CM | POA: Diagnosis not present

## 2016-10-18 DIAGNOSIS — R935 Abnormal findings on diagnostic imaging of other abdominal regions, including retroperitoneum: Secondary | ICD-10-CM | POA: Diagnosis not present

## 2016-10-18 DIAGNOSIS — R112 Nausea with vomiting, unspecified: Secondary | ICD-10-CM

## 2016-10-18 DIAGNOSIS — R634 Abnormal weight loss: Secondary | ICD-10-CM | POA: Diagnosis not present

## 2016-10-18 MED ORDER — DICYCLOMINE HCL 20 MG PO TABS: ORAL_TABLET | ORAL | 3 refills | Status: AC

## 2016-10-18 MED ORDER — OMEPRAZOLE 20 MG PO CPDR: DELAYED_RELEASE_CAPSULE | ORAL | 3 refills | Status: AC

## 2016-10-18 NOTE — Patient Instructions (Addendum)
Please go to the basement level to have your labs drawn.  We have sent the following medications to your pharmacy for you to pick up at your [facility].  1. Bentyl 10 mg 2. Omeprazole 20 mg  You have been scheduled for an endoscopy. Please follow written instructions given to you at your visit today. If you use inhalers (even only as needed), please bring them with you on the day of your procedure. Your physician has requested that you go to www.startemmi.com and enter the access code given to you at your visit today. This web site gives a general overview about your procedure. However, you should still follow specific instructions given to you by our office regarding your preparation for the procedure.   If you are age 29 or younger, your body mass index should be between 19-25. Your Body mass index is 28.03 kg/m. If this is out of the aformentioned range listed, please consider follow up with your Primary Care Provider.   You will be called with an appointment to see Drl Nadeen Landau at [facility].

## 2016-10-18 NOTE — Progress Notes (Signed)
Subjective:    Patient ID: Andre Padilla, male    DOB: 1985-03-12, 31 y.o.   MRN: [id number]  HPI Andre Padilla  is a pleasant 31 year old African-American male, new to GI today referred by [facility], Isla Pence M.D. for evaluation of abdominal pain and intermittent nausea and vomiting. Patient had an ER visit on 10/07/2016. He also been seen in the ER in February 2018, and had a visit in 2015 with CT of the abdomen and pelvis. Patient's mother relates that he has had symptoms over the past one and half years. He had had an episode of abdominal pain cramping nausea and vomiting when he had evaluation in 2015 but apparently that resolved and he did well until about a year and a half ago. Since that time he has had very frequent complaints of generalized abdominal pain which is worsened after eating and usually causes urgency for bowel movements. She says she has to run to the bathroom after every meal but patient says she passes formed stool and has not had problems with diarrhea melena or hematochezia. He has been eating smaller amounts and has lost about 50 pounds over the past 2 years. He says he feels uncomfortable if he eats too much and very full. Mom says he may vomit a couple of times every week. His symptoms on 10/07/2016 when he presented to the emergency room were worse than his usual with more complaints of pain and vomiting. He had plain abdominal films done which were unremarkable. EKG was negative, labs unremarkable with the exception of hemoglobin 14.2 and MCV of 64.5, LFTs were normal lipase normal. Looking back he has always had a low MCV in the 60-70 range. Review of records so CT of the abdomen and pelvis in February 2018 showed intestinal malrotation with: In the left hemiabdomen and the ileocecal valve and the left lower quadrant and duodenum/jejunal junction in the right upper quadrant there is no evidence for volvulus or obstruction at that time. Interestingly CT from  2015 showed the cecum to be situated within the right upper quadrant of the abdomen and ileocecal valve and appendix difficult to separate from the proximal colon. Patient has not had any prior abdominal surgeries. They are unaware of any family history of thalassemia. Patient has been taking omeprazole and Bentyl which he says helps a little bit but has not controlled his symptoms.  Review of Systems Pertinent positive and negative review of systems were noted in the above HPI section.  All other review of systems was otherwise negative.  Outpatient Encounter Prescriptions as of 10/18/2016  Medication Sig  . dicyclomine (BENTYL) 20 MG tablet Take 1 tableet by mouth 30 min before meals.  . famotidine (PEPCID) 20 MG tablet Take 1 tablet (20 mg total) by mouth 2 (two) times daily.  Marland Kitchen ibuprofen (ADVIL,MOTRIN) 200 MG tablet Take 400 mg by mouth every 6 (six) hours as needed for headache, mild pain or moderate pain.  Marland Kitchen omeprazole (PRILOSEC) 20 MG capsule Take 1 tablet by mouth every morning.  . sucralfate (CARAFATE) 1 g tablet Take 1 tablet (1 g total) by mouth 4 (four) times daily -  with meals and at bedtime.  . [DISCONTINUED] dicyclomine (BENTYL) 20 MG tablet Take 1 tablet (20 mg total) by mouth 2 (two) times daily.  . [DISCONTINUED] omeprazole (PRILOSEC) 20 MG capsule Take 1 capsule (20 mg total) by mouth daily.  . [DISCONTINUED] benzonatate (TESSALON) 100 MG capsule Take 1-2 capsules (100-200 mg total) by  mouth every 8 (eight) hours.   No facility-administered encounter medications on file as of 10/18/2016.    No Known Allergies There are no active problems to display for this patient.  Social History   Social History  . Marital status: Single    Spouse name: N/A  . Number of children: N/A  . Years of education: N/A   Occupational History  . Not on file.   Social History Main Topics  . Smoking status: Current Every Day Smoker    Packs/day: 0.15    Types: Cigarettes  . Smokeless  tobacco: Never Used  . Alcohol use No  . Drug use: No  . Sexual activity: Not on file   Other Topics Concern  . Not on file   Social History Narrative  . No narrative on file    Mr. Achterberg family history includes Asthma in his mother; Hypertension in his mother.      Objective:    Vitals:   10/18/16 1121  BP: (!) 100/58  Pulse: 71    Physical Exam  ;Well-developed young African-American male, in no acute distress, pleasant accompanied by his mother who gives most of history, blood pressure 100/58, pulse 71, height 5 foot 11, weight 201, BMI 28.0. HEENT ;nontraumatic normocephalic EOMI PERRLA sclera anicteric, Cardiovascular; regular rate and rhythm with S1-S2 no murmur rub or gallop, Pulmonary ;clear bilaterally, Abdomen; soft, bowel sounds are present he has fairly diffuse mild tenderness no guarding or rebound no palpable mass or hepatosplenomegaly, Rectal ;exam not done, Ext; no clubbing cyanosis or edema skin warm and dry, Neuropsych ;mood and affect appropriate       Assessment & Plan:   #81 31 year old African-American male with 1-1/2 year history of generalized/mid abdominal pain occurring very frequently, intermittent episodes of vomiting, weight loss of 50 pounds sensation of early satiety, and daily symptoms of cramping and urgency for bowel movements postprandially without diarrhea. Am concerned that he is symptomatic from intestinal malrotation. He has had change in position of the cecum between 2015 and 2018 on CT. Doubt peptic ulcer disease or chronic gastropathy but cannot rule out  #2 microcytosis without anemia-suspect thalassemia  Plan; Discussed with Dr. Ardis Hughs, we will proceed with EGD with Dr. Ardis Hughs. Procedure was discussed in detail with the patient and his mother and they're agreeable to proceed. Patient will be referred to [facility] surgery/Dr. Dema Severin Will continue omeprazole 20 mg by mouth every morning short-term Continue Bentyl to change to  10 mg 30 minutes before meals Hemoglobin electrophoresis.   Terrisha Lopata S Dossie Ocanas PA-C 10/18/2016   Cc: [place]

## 2016-10-18 NOTE — Progress Notes (Signed)
I agree with the above note, plan 

## 2016-10-22 LAB — HEMOGLOBINOPATHY EVALUATION
Fetal Hemoglobin Testing: <1 % (ref 0.0–1.9)
HEMATOCRIT: 48 % (ref 38.5–50.0)
HGB A: 97 % (ref >96.0)
Hemoglobin A2 - HGBRFX: 2 % (ref 1.8–3.5)
Hemoglobin: 14.3 g/dL (ref 13.2–17.1)
MCH: 21.3 pg — AB (ref 27.0–33.0)
MCV: 71.5 fL — AB (ref 80.0–100.0)
RBC: 6.71 10*6/uL — ABNORMAL HIGH (ref 4.20–5.80)
RDW: 18.8 % — ABNORMAL HIGH (ref 11.0–15.0)

## 2016-11-18 ENCOUNTER — Telehealth: Payer: Self-pay | Admitting: Internal Medicine

## 2016-11-18 ENCOUNTER — Encounter: Payer: Self-pay | Admitting: Gastroenterology

## 2016-11-18 ENCOUNTER — Ambulatory Visit (AMBULATORY_SURGERY_CENTER): Payer: Medicaid Other | Admitting: Gastroenterology

## 2016-11-18 VITALS — BP 109/75 | HR 56 | Temp 98.0°F | Resp 18 | Ht 71.0 in | Wt 201.0 lb

## 2016-11-18 DIAGNOSIS — R1115 Cyclical vomiting syndrome unrelated to migraine: Secondary | ICD-10-CM

## 2016-11-18 DIAGNOSIS — K2970 Gastritis, unspecified, without bleeding: Secondary | ICD-10-CM | POA: Diagnosis not present

## 2016-11-18 DIAGNOSIS — K2990 Gastroduodenitis, unspecified, without bleeding: Secondary | ICD-10-CM | POA: Diagnosis not present

## 2016-11-18 DIAGNOSIS — G43A1 Cyclical vomiting, intractable: Secondary | ICD-10-CM

## 2016-11-18 DIAGNOSIS — K3189 Other diseases of stomach and duodenum: Secondary | ICD-10-CM | POA: Diagnosis not present

## 2016-11-18 MED ORDER — SODIUM CHLORIDE 0.9 % IV SOLN: 500.0000 mL | INTRAVENOUS | Status: DC

## 2016-11-18 NOTE — Progress Notes (Signed)
Called to room to assist during endoscopic procedure.  Patient ID and intended procedure confirmed with present staff. Received instructions for my participation in the procedure from the performing physician.  

## 2016-11-18 NOTE — Patient Instructions (Signed)
YOU HAD AN ENDOSCOPIC PROCEDURE TODAY AT THE  ENDOSCOPY CENTER:   Refer to the procedure report that was given to you for any specific questions about what was found during the examination.  If the procedure report does not answer your questions, please call your gastroenterologist to clarify.  If you requested that your care partner not be given the details of your procedure findings, then the procedure report has been included in a sealed envelope for you to review at your convenience later.  YOU SHOULD EXPECT: Some feelings of bloating in the abdomen. Passage of more gas than usual.  Walking can help get rid of the air that was put into your GI tract during the procedure and reduce the bloating. If you had a lower endoscopy (such as a colonoscopy or flexible sigmoidoscopy) you may notice spotting of blood in your stool or on the toilet paper. If you underwent a bowel prep for your procedure, you may not have a normal bowel movement for a few days.  Please Note:  You might notice some irritation and congestion in your nose or some drainage.  This is from the oxygen used during your procedure.  There is no need for concern and it should clear up in a day or so.  SYMPTOMS TO REPORT IMMEDIATELY:   Following upper endoscopy (EGD)  Vomiting of blood or coffee ground material  New chest pain or pain under the shoulder blades  Painful or persistently difficult swallowing  New shortness of breath  Fever of 100F or higher  Black, tarry-looking stools  For urgent or emergent issues, a gastroenterologist can be reached at any hour by calling [phone].   DIET:  We do recommend a small meal at first, but then you may proceed to your regular diet.  Drink plenty of fluids but you should avoid alcoholic beverages for 24 hours.  ACTIVITY:  You should plan to take it easy for the rest of today and you should NOT DRIVE or use heavy machinery until tomorrow (because of the sedation medicines used  during the test).    FOLLOW UP: Our staff will call the number listed on your records the next business day following your procedure to check on you and address any questions or concerns that you may have regarding the information given to you following your procedure. If we do not reach you, we will leave a message.  However, if you are feeling well and you are not experiencing any problems, there is no need to return our call.  We will assume that you have returned to your regular daily activities without incident.  If any biopsies were taken you will be contacted by phone or by letter within the next 1-3 weeks.  Please call us at [phone] if you have not heard about the biopsies in 3 weeks.  Await for biopsy results  SIGNATURES/CONFIDENTIALITY: You and/or your care partner have signed paperwork which will be entered into your electronic medical record.  These signatures attest to the fact that that the information above on your After Visit Summary has been reviewed and is understood.  Full responsibility of the confidentiality of this discharge information lies with you and/or your care-partner. 

## 2016-11-18 NOTE — Op Note (Signed)
[place] Patient Name: Tregan Read Procedure Date: 11/18/2016 3:14 PM MRN: [id number] Endoscopist: Milus Banister , MD Age: 31 Referring MD:  Date of Birth: 1985-07-25 Gender: Male Account #: [id number] Procedure:                Upper GI endoscopy Indications:              Generalized abdominal pain, Weight loss Medicines:                Monitored Anesthesia Care Procedure:                Pre-Anesthesia Assessment:                           - Prior to the procedure, a History and Physical                            was performed, and patient medications and                            allergies were reviewed. The patient's tolerance of                            previous anesthesia was also reviewed. The risks                            and benefits of the procedure and the sedation                            options and risks were discussed with the patient.                            All questions were answered, and informed consent                            was obtained. Prior Anticoagulants: The patient has                            taken no previous anticoagulant or antiplatelet                            agents. ASA Grade Assessment: II - A patient with                            mild systemic disease. After reviewing the risks                            and benefits, the patient was deemed in                            satisfactory condition to undergo the procedure.                           After obtaining informed consent, the endoscope was  passed under direct vision. Throughout the                            procedure, the patient's blood pressure, pulse, and                            oxygen saturations were monitored continuously. The                            Endoscope was introduced through the mouth, and                            advanced to the second part of duodenum. The upper                            GI endoscopy was  accomplished without difficulty.                            The patient tolerated the procedure well. Scope In: Scope Out: Findings:                 The esophagus was normal.                           Mild inflammation characterized by erythema and                            friability was found in the gastric antrum.                            Biopsies were taken with a cold forceps for                            histology.                           The examined duodenum was normal. Complications:            No immediate complications. Estimated blood loss:                            None. Estimated Blood Loss:     Estimated blood loss: none. Impression:               - Normal esophagus.                           - Gastritis. Biopsied.                           - Normal examined duodenum. Recommendation:           - Patient has a contact number available for                            emergencies. The signs and symptoms of potential  delayed complications were discussed with the                            patient. Return to normal activities tomorrow.                            Written discharge instructions were provided to the                            patient.                           - Resume previous diet.                           - Continue present medications.                           - Await pathology results. May need to go ahead                            with CC surgery referral for gut malrotation                            pending these results. Milus Banister, MD 11/18/2016 3:40:34 PM This report has been signed electronically.

## 2016-11-18 NOTE — Progress Notes (Signed)
Report to PACU, RN, vss, BBS= Clear.  

## 2016-11-18 NOTE — Telephone Encounter (Signed)
Pt had EGD this afternoon  Contacted by patient's aunt whjo is with him now Gastritis noted which was biopsied, Pt went home, rested and had a light meal. Developed abd pain with nausea and vomiting. No hematemesis No melena No report of fever.  They do not have thermometer at home.  I advised that if symptoms worsen then he should go to the ER overnight Office to check on him tomorrow I will alert Dr. Ardis Hughs

## 2016-11-19 ENCOUNTER — Other Ambulatory Visit: Payer: Self-pay

## 2016-11-19 ENCOUNTER — Ambulatory Visit (INDEPENDENT_AMBULATORY_CARE_PROVIDER_SITE_OTHER)
Admission: RE | Admit: 2016-11-19 | Discharge: 2016-11-19 | Disposition: A | Discharge status: Home / Self Care | Payer: Medicaid Other | Source: Ambulatory Visit | Attending: Gastroenterology | Admitting: Gastroenterology

## 2016-11-19 ENCOUNTER — Other Ambulatory Visit (INDEPENDENT_AMBULATORY_CARE_PROVIDER_SITE_OTHER): Payer: Medicaid Other

## 2016-11-19 ENCOUNTER — Telehealth: Payer: Self-pay | Admitting: *Deleted

## 2016-11-19 DIAGNOSIS — R109 Unspecified abdominal pain: Secondary | ICD-10-CM

## 2016-11-19 LAB — COMPREHENSIVE METABOLIC PANEL
ALBUMIN: 4.2 g/dL (ref 3.5–5.2)
ALT: 23 U/L (ref 0–53)
AST: 18 U/L (ref 0–37)
Alkaline Phosphatase: 62 U/L (ref 39–117)
BILIRUBIN TOTAL: 0.4 mg/dL (ref 0.2–1.2)
BUN: 9 mg/dL (ref 6–23)
CALCIUM: 9.4 mg/dL (ref 8.4–10.5)
CHLORIDE: 104 meq/L (ref 96–112)
CO2: 29 mEq/L (ref 19–32)
CREATININE: 0.92 mg/dL (ref 0.40–1.50)
GFR: 124.96 mL/min (ref >60.00)
Glucose, Bld: 89 mg/dL (ref 70–99)
Potassium: 4.1 mEq/L (ref 3.5–5.1)
Sodium: 137 mEq/L (ref 135–145)
Total Protein: 7 g/dL (ref 6.0–8.3)

## 2016-11-19 LAB — CBC WITH DIFFERENTIAL/PLATELET
BASOS ABS: 0.1 10*3/uL (ref 0.0–0.1)
Basophils Relative: 1.3 % (ref 0.0–3.0)
EOS ABS: 0.1 10*3/uL (ref 0.0–0.7)
Eosinophils Relative: 1.4 % (ref 0.0–5.0)
HEMATOCRIT: 43 % (ref 39.0–52.0)
HEMOGLOBIN: 13.8 g/dL (ref 13.0–17.0)
LYMPHS PCT: 37.9 % (ref 12.0–46.0)
Lymphs Abs: 3.2 10*3/uL (ref 0.7–4.0)
MCHC: 32.1 g/dL (ref 30.0–36.0)
MCV: 67.9 fl — ABNORMAL LOW (ref 78.0–100.0)
Monocytes Absolute: 0.4 10*3/uL (ref 0.1–1.0)
Monocytes Relative: 4.2 % (ref 3.0–12.0)
Neutro Abs: 4.7 10*3/uL (ref 1.4–7.7)
Neutrophils Relative %: 55.2 % (ref 43.0–77.0)
Platelets: 272 10*3/uL (ref 150.0–400.0)
RBC: 6.33 Mil/uL — AB (ref 4.22–5.81)
RDW: 16.2 % — ABNORMAL HIGH (ref 11.5–15.5)
WBC: 8.4 10*3/uL (ref 4.0–10.5)

## 2016-11-19 MED ORDER — PROMETHAZINE HCL 50 MG RE SUPP: 50.0000 mg | Freq: Two times a day (BID) | RECTAL | 0 refills | Status: AC

## 2016-11-19 NOTE — Telephone Encounter (Signed)
See additional phone note. 

## 2016-11-19 NOTE — Telephone Encounter (Signed)
Pt aware, orders in epic and script sent to pharmacy.

## 2016-11-19 NOTE — Telephone Encounter (Signed)
  Follow up Call-  Call back number 11/18/2016  Post procedure Call Back phone  # [phone] ok to talk with her.  Permission to leave phone message Yes  Some recent data might be hidden    Spoke with aunt Patient questions:  Do you have a fever, pain , or abdominal swelling? Yes.   Pain Score  0 *  Have you tolerated food without any problems? No.  Have you been able to return to your normal activities? No.  Do you have any questions about your discharge instructions: Diet   No. Medications  No. Follow up visit  No.  Do you have questions or concerns about your Care? No.  Actions: * If pain score is 4 or above: Physician/ provider Notified : Owens Loffler, MD   Spoke with pt's aunt and she told me the pt is still having emesis and abdominal pain.  I noted she had called the on call MD and that Dr. Ardis Hughs is aware of the problems the pt is having.  I told her that Dr. Ardis Hughs' nurse will be calling this morning.

## 2016-11-19 NOTE — Progress Notes (Signed)
Spoke with pts Aunt and she is aware. Orders in epic and scripts sent to pharmacy.

## 2016-11-19 NOTE — Telephone Encounter (Signed)
Andre Padilla, Can you check on him this morning.

## 2016-11-19 NOTE — Telephone Encounter (Signed)
Spoke with pts Aunt and she states that he is still c/o his stomach hurting a lot and he is still vomiting. Reports he is not able to keep anything down. No blood noted in the emesis. Please advise.

## 2016-11-19 NOTE — Telephone Encounter (Signed)
Ok, he needs labs (cbc, cmet).  Also phenergan suppository 25mg , place one bid as needed, disp 50.  Also KUB (obstructive series) today.

## 2016-11-20 ENCOUNTER — Other Ambulatory Visit: Payer: Self-pay

## 2016-11-20 MED ORDER — ONDANSETRON HCL 4 MG PO TABS: 4.0000 mg | ORAL_TABLET | Freq: Two times a day (BID) | ORAL | 3 refills | Status: AC

## 2016-11-25 ENCOUNTER — Other Ambulatory Visit: Payer: Self-pay

## 2016-11-25 DIAGNOSIS — Q433 Congenital malformations of intestinal fixation: Secondary | ICD-10-CM

## 2016-11-25 NOTE — Progress Notes (Signed)
The pt will call back to set up [place]. Referral made to [facility].

## 2016-12-17 ENCOUNTER — Other Ambulatory Visit: Payer: Self-pay | Admitting: Surgery

## 2016-12-17 DIAGNOSIS — Q433 Congenital malformations of intestinal fixation: Secondary | ICD-10-CM

## 2017-01-02 ENCOUNTER — Ambulatory Visit
Admission: RE | Admit: 2017-01-02 | Discharge: 2017-01-02 | Disposition: A | Discharge status: Home / Self Care | Payer: Medicaid Other | Source: Ambulatory Visit | Attending: Surgery | Admitting: Surgery

## 2017-01-02 DIAGNOSIS — Q433 Congenital malformations of intestinal fixation: Secondary | ICD-10-CM

## 2017-01-27 ENCOUNTER — Ambulatory Visit: Payer: Self-pay | Admitting: Surgery

## 2017-01-27 NOTE — H&P (Signed)
History of Present Illness Andre Gave M. Juanantonio Stolar MD; 01/27/2017 2:19 PM) Patient words: CC: Intermittent crampy abdominal pain and bloating  HPI: Andre Padilla is a very pleasant 32yo gentleman here today for evaluation of possible intestinal malrotation. He has a 38yr hx of intermittent crampy abdominal pain - occurring multiple times per week and occurs after eating meals. +nausea and bloating, rare emesis. Often has diarrhea and complains that immediately after eating everything passes right through him. He had a CT performed 02/2016 to further evaluate which demonstrated no acute abnormality but did note intestinal malrotation with the colon in the left hemiabdomen, ICV in LLQ and duodeno-jejunal junction being located in the right upper quadrant. He has been seen by GI as well and referred here for further evaluation. Reports 20-30lb wt loss in the last year.  He saw in the office 12/16/16 and obtained an upper GI on 01/02/17.  This showed a normal-appearing esophagus and stomach as well as duodenal bulb.  There was malrotation of the small bowel and colon without evidence of obstruction.  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  FHx: Denies FHx of malignancy  Social: Denies use of tobacco/EtOH/drugs  ROS: A comprehensive 10 system review of systems was completed with the patient and pertinent findings as noted above.  The patient is a 32 year old male.   Allergies Alean Rinne, [place]; 01/27/2017 1:44 PM) No Known Drug Allergies  [12/16/2016]: Allergies Reconciled    Medication History Alean Rinne, [place]; 01/27/2017 1:44 PM) Omeprazole  (20MG  Capsule DR, Oral) Active. Ondansetron HCl  (4MG  Tablet, Oral) Active. Promethegan  (50MG  Suppository, Rectal) Active. Sucralfate  (1GM Tablet, Oral) Active. Medications Reconciled   Vitals Mardene Celeste King RMA; 01/27/2017 1:44 PM) 01/27/2017 1:43  PM Weight: 205 lb   Height: 73 in  Body Surface Area: 2.17 m   Body Mass Index: 27.05 kg/m   Temp.: 98.5 F    Pulse: 98 (Regular)    BP: 125/78 (Sitting, Left Arm, Standard)       Physical Exam Andre Gave M. Devanee Pomplun MD; 01/27/2017 2:20 PM) The physical exam findings are as follows: Note: Constitutional: No acute distress; conversant; no deformities Eyes: Moist conjunctiva; no lid lag; anicteric sclerae; pupils equal round and reactive to light Neck: Trachea midline; no palpable thyromegaly Lungs: Normal respiratory effort; no tactile fremitus CV: Regular rate and rhythm; no palpable thrill; no pitting edema GI: Abdomen soft, nontender, nondistended; no palpable hepatosplenomegaly MSK: Normal gait; no clubbing/cyanosis Psychiatric: Appropriate affect, somewhat blunted; alert and oriented 3 Lymphatic: No palpable cervical or axillary lymphadenopathy    Assessment & Plan Andre Gave M. 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 Given all this I believe he'll benefit from a Ladd's procedure  -Advised he avoid high fiber foods/ruffage for time being - eating a more bland diet with protein shakes, meat/potatoes etc -We will schedule him for exposure laparotomy, Ladd's procedure -The anatomy and physiology of the GI tract was discussed at length with the patient. The pathophysiology of malrotation was discussed at length with associated pictures and illustrations. We discussed the open technique for addressing  his problem and rationale for surgery. We discussed that his symptoms will likely improve with the procedure but may not  completely resolve. We discussed the operative approach for performing this including evisceration, derotation of the gut, division of the Ladd's bands with widening of the mesenteric base and appendectomy. We will additionally plan to run the entire bowel from the stomach to the peritoneal reflection of the rectum. -The planned procedure, material risks (including, but not limited to, pain, bleeding, infection, scarring, need for blood transfusion, damage to surrounding structures- blood vessels/nerves/viscus/organs, damage to ureter, need for additional procedures, hernia, pneumonia, heart attack, stroke, death) benefits and alternatives to surgery were discussed at length. I noted a good probability that the procedure would help improve his symptoms. The patient and his Aunt's questions were answered to their satisfaction and they elected to proceed with surgery.  Signed electronically by Ileana Roup, MD (01/27/2017 2:25 PM)

## 2017-02-14 NOTE — Progress Notes (Signed)
EKG 10-07-16 Epic  CXR 10-07-16 Epic

## 2017-02-14 NOTE — Patient Instructions (Signed)
Andre Padilla  02/14/2017   Your procedure is scheduled on: 02-19-17   Report to [facility] Main  Entrance    Report to admitting at 6:30AM   Call this number if you have problems the morning of surgery [phone]     Remember: Do not eat food or drink liquids :After Midnight.     Take these medicines the morning of surgery with A SIP OF WATER: NONE                                You may not have any metal on your body including hair pins and              piercings  Do not wear jewelry, make-up, lotions, powders or perfumes, deodorant              Men may shave face and neck.   Do not bring valuables to the hospital. [place].  Contacts, dentures or bridgework may not be worn into surgery.  Leave suitcase in the car. After surgery it may be brought to your room.                 Please read over the following fact sheets you were given: _____________________________________________________________________             [facility] - Preparing for Surgery Before surgery, you can play an important role.  Because skin is not sterile, your skin needs to be as free of germs as possible.  You can reduce the number of germs on your skin by washing with CHG (chlorahexidine gluconate) soap before surgery.  CHG is an antiseptic cleaner which kills germs and bonds with the skin to continue killing germs even after washing. Please DO NOT use if you have an allergy to CHG or antibacterial soaps.  If your skin becomes reddened/irritated stop using the CHG and inform your nurse when you arrive at Short Stay. Do not shave (including legs and underarms) for at least 48 hours prior to the first CHG shower.  You may shave your face/neck. Please follow these instructions carefully:  1.  Shower with CHG Soap the night before surgery and the  morning of Surgery.  2.  If you choose to wash your hair, wash your hair first as  usual with your  normal  shampoo.  3.  After you shampoo, rinse your hair and body thoroughly to remove the  shampoo.                           4.  Use CHG as you would any other liquid soap.  You can apply chg directly  to the skin and wash                       Gently with a scrungie or clean washcloth.  5.  Apply the CHG Soap to your body ONLY FROM THE NECK DOWN.   Do not use on face/ open                           Wound or open sores. Avoid contact with eyes,  ears mouth and genitals (private parts).                       Wash face,  Genitals (private parts) with your normal soap.             6.  Wash thoroughly, paying special attention to the area where your surgery  will be performed.  7.  Thoroughly rinse your body with warm water from the neck down.  8.  DO NOT shower/wash with your normal soap after using and rinsing off  the CHG Soap.                9.  Pat yourself dry with a clean towel.            10.  Wear clean pajamas.            11.  Place clean sheets on your bed the night of your first shower and do not  sleep with pets. Day of Surgery : Do not apply any lotions/deodorants the morning of surgery.  Please wear clean clothes to the hospital/surgery center.  FAILURE TO FOLLOW THESE INSTRUCTIONS MAY RESULT IN THE CANCELLATION OF YOUR SURGERY PATIENT SIGNATURE_________________________________  NURSE SIGNATURE__________________________________  ________________________________________________________________________   Adam Phenix  An incentive spirometer is a tool that can help keep your lungs clear and active. This tool measures how well you are filling your lungs with each breath. Taking long deep breaths may help reverse or decrease the chance of developing breathing (pulmonary) problems (especially infection) following:  A long period of time when you are unable to move or be active. BEFORE THE PROCEDURE   If the spirometer includes an indicator to show your  best effort, your nurse or respiratory therapist will set it to a desired goal.  If possible, sit up straight or lean slightly forward. Try not to slouch.  Hold the incentive spirometer in an upright position. INSTRUCTIONS FOR USE  1. Sit on the edge of your bed if possible, or sit up as far as you can in bed or on a chair. 2. Hold the incentive spirometer in an upright position. 3. Breathe out normally. 4. Place the mouthpiece in your mouth and seal your lips tightly around it. 5. Breathe in slowly and as deeply as possible, raising the piston or the ball toward the top of the column. 6. Hold your breath for 3-5 seconds or for as long as possible. Allow the piston or ball to fall to the bottom of the column. 7. Remove the mouthpiece from your mouth and breathe out normally. 8. Rest for a few seconds and repeat Steps 1 through 7 at least 10 times every 1-2 hours when you are awake. Take your time and take a few normal breaths between deep breaths. 9. The spirometer may include an indicator to show your best effort. Use the indicator as a goal to work toward during each repetition. 10. After each set of 10 deep breaths, practice coughing to be sure your lungs are clear. If you have an incision (the cut made at the time of surgery), support your incision when coughing by placing a pillow or rolled up towels firmly against it. Once you are able to get out of bed, walk around indoors and cough well. You may stop using the incentive spirometer when instructed by your caregiver.  RISKS AND COMPLICATIONS  Take your time so you do not get dizzy or light-headed.  If you are in  pain, you may need to take or ask for pain medication before doing incentive spirometry. It is harder to take a deep breath if you are having pain. AFTER USE  Rest and breathe slowly and easily.  It can be helpful to keep track of a log of your progress. Your caregiver can provide you with a simple table to help with this. If  you are using the spirometer at home, follow these instructions: [place] IF:   You are having difficultly using the spirometer.  You have trouble using the spirometer as often as instructed.  Your pain medication is not giving enough relief while using the spirometer.  You develop fever of 100.5 F (38.1 C) or higher. SEEK IMMEDIATE MEDICAL CARE IF:   You cough up bloody sputum that had not been present before.  You develop fever of 102 F (38.9 C) or greater.  You develop worsening pain at or near the incision site. MAKE SURE YOU:   Understand these instructions.  Will watch your condition.  Will get help right away if you are not doing well or get worse. Document Released: 05/13/2006 Document Revised: 03/25/2011 Document Reviewed: 07/14/2006 [facility] Patient Information 2014 [place], [place].   ________________________________________________________________________

## 2017-02-18 ENCOUNTER — Encounter (HOSPITAL_COMMUNITY)
Admission: RE | Admit: 2017-02-18 | Discharge: 2017-02-18 | Disposition: A | Discharge status: Home / Self Care | Payer: Medicaid Other | Source: Ambulatory Visit | Attending: Surgery | Admitting: Surgery

## 2017-02-18 ENCOUNTER — Encounter (HOSPITAL_COMMUNITY): Payer: Self-pay

## 2017-02-18 ENCOUNTER — Other Ambulatory Visit: Payer: Self-pay

## 2017-02-18 DIAGNOSIS — Q433 Congenital malformations of intestinal fixation: Secondary | ICD-10-CM | POA: Diagnosis not present

## 2017-02-18 DIAGNOSIS — Z01812 Encounter for preprocedural laboratory examination: Secondary | ICD-10-CM | POA: Diagnosis present

## 2017-02-18 HISTORY — DX: Unspecified abdominal pain: R10.9

## 2017-02-18 LAB — CBC WITH DIFFERENTIAL/PLATELET
Basophils Absolute: 0.1 K/uL (ref 0.0–0.1)
Basophils Relative: 1 %
Eosinophils Absolute: 0.1 K/uL (ref 0.0–0.7)
Eosinophils Relative: 1 %
HCT: 43.7 % (ref 39.0–52.0)
Hemoglobin: 14.1 g/dL (ref 13.0–17.0)
Lymphocytes Relative: 37 %
Lymphs Abs: 3.3 K/uL (ref 0.7–4.0)
MCH: 21.7 pg — ABNORMAL LOW (ref 26.0–34.0)
MCHC: 32.3 g/dL (ref 30.0–36.0)
MCV: 67.2 fL — ABNORMAL LOW (ref 78.0–100.0)
Monocytes Absolute: 0.4 K/uL (ref 0.1–1.0)
Monocytes Relative: 5 %
Neutro Abs: 4.9 K/uL (ref 1.7–7.7)
Neutrophils Relative %: 56 %
Platelets: 298 K/uL (ref 150–400)
RBC: 6.5 MIL/uL — ABNORMAL HIGH (ref 4.22–5.81)
RDW: 16.1 % — ABNORMAL HIGH (ref 11.5–15.5)
WBC: 8.8 K/uL (ref 4.0–10.5)

## 2017-02-18 LAB — APTT: aPTT: 33 s (ref 24–36)

## 2017-02-18 LAB — COMPREHENSIVE METABOLIC PANEL
ALK PHOS: 59 U/L (ref 38–126)
ALT: 24 U/L (ref 17–63)
AST: 26 U/L (ref 15–41)
Albumin: 4.5 g/dL (ref 3.5–5.0)
Anion gap: 9 (ref 5–15)
BILIRUBIN TOTAL: 0.3 mg/dL (ref 0.3–1.2)
BUN: 11 mg/dL (ref 6–20)
CO2: 24 mmol/L (ref 22–32)
CREATININE: 0.92 mg/dL (ref 0.61–1.24)
Calcium: 9 mg/dL (ref 8.9–10.3)
Chloride: 105 mmol/L (ref 101–111)
GFR calc Af Amer: >60 mL/min (ref >60)
GFR calc non Af Amer: >60 mL/min (ref >60)
Glucose, Bld: 64 mg/dL — ABNORMAL LOW (ref 65–99)
Potassium: 3.9 mmol/L (ref 3.5–5.1)
Sodium: 138 mmol/L (ref 135–145)
TOTAL PROTEIN: 7.3 g/dL (ref 6.5–8.1)

## 2017-02-18 LAB — HEMOGLOBIN A1C
Hgb A1c MFr Bld: 5.5 % (ref 4.8–5.6)
Mean Plasma Glucose: 111.15 mg/dL

## 2017-02-18 LAB — PROTIME-INR
INR: 1.01
Prothrombin Time: 13.2 seconds (ref 11.4–15.2)

## 2017-02-18 NOTE — Progress Notes (Signed)
CBC/DIFF ROUTED VIA Epic TO Dr Annye English

## 2017-02-19 ENCOUNTER — Encounter (HOSPITAL_COMMUNITY)
Admission: RE | Disposition: A | Discharge status: Home / Self Care | Payer: Self-pay | Source: Ambulatory Visit | Attending: Surgery

## 2017-02-19 ENCOUNTER — Other Ambulatory Visit: Payer: Self-pay

## 2017-02-19 ENCOUNTER — Encounter (HOSPITAL_COMMUNITY): Payer: Self-pay | Admitting: *Deleted

## 2017-02-19 ENCOUNTER — Inpatient Hospital Stay (HOSPITAL_COMMUNITY): Payer: Medicaid Other | Admitting: Certified Registered Nurse Anesthetist

## 2017-02-19 ENCOUNTER — Inpatient Hospital Stay (HOSPITAL_COMMUNITY)
Admission: RE | Admit: 2017-02-19 | Discharge: 2017-02-21 | DRG: 331 | Disposition: A | Discharge status: Home / Self Care | Payer: Medicaid Other | Source: Ambulatory Visit | Attending: Surgery | Admitting: Surgery

## 2017-02-19 DIAGNOSIS — Q433 Congenital malformations of intestinal fixation: Principal | ICD-10-CM

## 2017-02-19 DIAGNOSIS — D72828 Other elevated white blood cell count: Secondary | ICD-10-CM | POA: Diagnosis present

## 2017-02-19 DIAGNOSIS — Z23 Encounter for immunization: Secondary | ICD-10-CM

## 2017-02-19 DIAGNOSIS — F17200 Nicotine dependence, unspecified, uncomplicated: Secondary | ICD-10-CM | POA: Diagnosis present

## 2017-02-19 HISTORY — PX: APPENDECTOMY: SHX54

## 2017-02-19 HISTORY — PX: LAPAROTOMY: SHX154

## 2017-02-19 SURGERY — LAPAROTOMY, EXPLORATORY
Anesthesia: General | Site: Abdomen

## 2017-02-19 MED ORDER — HYDRALAZINE HCL 20 MG/ML IJ SOLN: 10.0000 mg | INTRAMUSCULAR | Status: DC | PRN

## 2017-02-19 MED ORDER — ROCURONIUM BROMIDE 10 MG/ML (PF) SYRINGE
PREFILLED_SYRINGE | INTRAVENOUS | Status: AC
  Filled 2017-02-19: qty 5

## 2017-02-19 MED ORDER — BUPIVACAINE-EPINEPHRINE 0.25% -1:200000 IJ SOLN
INTRAMUSCULAR | Status: AC
  Filled 2017-02-19: qty 1

## 2017-02-19 MED ORDER — LIDOCAINE 2% (20 MG/ML) 5 ML SYRINGE
INTRAMUSCULAR | Status: DC | PRN
  Administered 2017-02-19: 1.5 mg/kg/h via INTRAVENOUS

## 2017-02-19 MED ORDER — KETOROLAC TROMETHAMINE 15 MG/ML IJ SOLN
15.0000 mg | Freq: Four times a day (QID) | INTRAMUSCULAR | Status: AC
  Administered 2017-02-19 – 2017-02-21 (×8): 15 mg via INTRAVENOUS
  Filled 2017-02-19 (×7): qty 1

## 2017-02-19 MED ORDER — CHLORHEXIDINE GLUCONATE CLOTH 2 % EX PADS: 6.0000 | MEDICATED_PAD | Freq: Once | CUTANEOUS | Status: DC

## 2017-02-19 MED ORDER — MIDAZOLAM HCL 2 MG/2ML IJ SOLN
INTRAMUSCULAR | Status: AC
  Filled 2017-02-19: qty 2

## 2017-02-19 MED ORDER — HEPARIN SODIUM (PORCINE) 5000 UNIT/ML IJ SOLN
5000.0000 [IU] | Freq: Three times a day (TID) | INTRAMUSCULAR | Status: DC
  Administered 2017-02-19 – 2017-02-21 (×5): 5000 [IU] via SUBCUTANEOUS
  Filled 2017-02-19 (×5): qty 1

## 2017-02-19 MED ORDER — PROPOFOL 10 MG/ML IV BOLUS
INTRAVENOUS | Status: DC | PRN
  Administered 2017-02-19: 200 mg via INTRAVENOUS

## 2017-02-19 MED ORDER — DEXAMETHASONE SODIUM PHOSPHATE 4 MG/ML IJ SOLN
INTRAMUSCULAR | Status: DC | PRN
  Administered 2017-02-19: 10 mg via INTRAVENOUS

## 2017-02-19 MED ORDER — FENTANYL CITRATE (PF) 250 MCG/5ML IJ SOLN
INTRAMUSCULAR | Status: AC
  Filled 2017-02-19: qty 5

## 2017-02-19 MED ORDER — FENTANYL CITRATE (PF) 100 MCG/2ML IJ SOLN: 25.0000 ug | INTRAMUSCULAR | Status: DC | PRN

## 2017-02-19 MED ORDER — LACTATED RINGERS IV SOLN
INTRAVENOUS | Status: DC
Start: 2017-02-19 — End: 2017-02-20
  Administered 2017-02-19: 16:00:00 via INTRAVENOUS

## 2017-02-19 MED ORDER — BUPIVACAINE-EPINEPHRINE 0.25% -1:200000 IJ SOLN
INTRAMUSCULAR | Status: DC | PRN
  Administered 2017-02-19: 50 mL

## 2017-02-19 MED ORDER — FENTANYL CITRATE (PF) 100 MCG/2ML IJ SOLN
INTRAMUSCULAR | Status: DC | PRN
  Administered 2017-02-19 (×7): 50 ug via INTRAVENOUS

## 2017-02-19 MED ORDER — ROCURONIUM BROMIDE 50 MG/5ML IV SOSY
PREFILLED_SYRINGE | INTRAVENOUS | Status: DC | PRN
  Administered 2017-02-19: 10 mg via INTRAVENOUS
  Administered 2017-02-19: 50 mg via INTRAVENOUS

## 2017-02-19 MED ORDER — SUGAMMADEX SODIUM 500 MG/5ML IV SOLN
INTRAVENOUS | Status: AC
  Filled 2017-02-19: qty 5

## 2017-02-19 MED ORDER — INFLUENZA VAC SPLIT QUAD 0.5 ML IM SUSY
0.5000 mL | PREFILLED_SYRINGE | INTRAMUSCULAR | Status: AC
  Administered 2017-02-20: 0.5 mL via INTRAMUSCULAR
  Filled 2017-02-19: qty 0.5

## 2017-02-19 MED ORDER — ALVIMOPAN 12 MG PO CAPS
12.0000 mg | ORAL_CAPSULE | ORAL | Status: AC
  Administered 2017-02-19: 12 mg via ORAL
  Filled 2017-02-19: qty 1

## 2017-02-19 MED ORDER — MIDAZOLAM HCL 5 MG/5ML IJ SOLN
INTRAMUSCULAR | Status: DC | PRN
  Administered 2017-02-19 (×2): 1 mg via INTRAVENOUS

## 2017-02-19 MED ORDER — SODIUM CHLORIDE 0.9 % IR SOLN
Status: DC | PRN
  Administered 2017-02-19: 2000 mL

## 2017-02-19 MED ORDER — ACETAMINOPHEN 500 MG PO TABS
1000.0000 mg | ORAL_TABLET | ORAL | Status: AC
  Administered 2017-02-19: 1000 mg via ORAL
  Filled 2017-02-19: qty 2

## 2017-02-19 MED ORDER — FENTANYL CITRATE (PF) 100 MCG/2ML IJ SOLN
INTRAMUSCULAR | Status: AC
  Filled 2017-02-19: qty 2

## 2017-02-19 MED ORDER — HYDROMORPHONE HCL 1 MG/ML IJ SOLN
0.5000 mg | INTRAMUSCULAR | Status: DC | PRN
  Administered 2017-02-19: 0.5 mg via INTRAVENOUS
  Filled 2017-02-19: qty 0.5

## 2017-02-19 MED ORDER — GABAPENTIN 300 MG PO CAPS
300.0000 mg | ORAL_CAPSULE | ORAL | Status: AC
  Administered 2017-02-19: 300 mg via ORAL
  Filled 2017-02-19: qty 1

## 2017-02-19 MED ORDER — FAMOTIDINE 20 MG PO TABS
20.0000 mg | ORAL_TABLET | Freq: Two times a day (BID) | ORAL | Status: DC
  Administered 2017-02-19 – 2017-02-21 (×5): 20 mg via ORAL
  Filled 2017-02-19 (×6): qty 1

## 2017-02-19 MED ORDER — LACTATED RINGERS IV SOLN
INTRAVENOUS | Status: DC | PRN
  Administered 2017-02-19 (×2): via INTRAVENOUS

## 2017-02-19 MED ORDER — BUPIVACAINE LIPOSOME 1.3 % IJ SUSP
20.0000 mL | Freq: Once | INTRAMUSCULAR | Status: AC
  Administered 2017-02-19: 20 mL
  Filled 2017-02-19: qty 20

## 2017-02-19 MED ORDER — ONDANSETRON HCL 4 MG/2ML IJ SOLN
INTRAMUSCULAR | Status: AC
  Filled 2017-02-19: qty 2

## 2017-02-19 MED ORDER — HEPARIN SODIUM (PORCINE) 5000 UNIT/ML IJ SOLN
5000.0000 [IU] | Freq: Once | INTRAMUSCULAR | Status: AC
  Administered 2017-02-19: 5000 [IU] via SUBCUTANEOUS
  Filled 2017-02-19: qty 1

## 2017-02-19 MED ORDER — TRAMADOL HCL 50 MG PO TABS
50.0000 mg | ORAL_TABLET | Freq: Four times a day (QID) | ORAL | Status: DC | PRN
  Administered 2017-02-19 – 2017-02-21 (×4): 50 mg via ORAL
  Filled 2017-02-19 (×5): qty 1

## 2017-02-19 MED ORDER — ACETAMINOPHEN 325 MG PO TABS
650.0000 mg | ORAL_TABLET | Freq: Four times a day (QID) | ORAL | Status: DC
  Administered 2017-02-19 – 2017-02-21 (×7): 650 mg via ORAL
  Filled 2017-02-19 (×7): qty 2

## 2017-02-19 MED ORDER — SUCRALFATE 1 G PO TABS
1.0000 g | ORAL_TABLET | Freq: Three times a day (TID) | ORAL | Status: DC
  Administered 2017-02-19 – 2017-02-21 (×7): 1 g via ORAL
  Filled 2017-02-19 (×8): qty 1

## 2017-02-19 MED ORDER — CEFOTETAN DISODIUM-DEXTROSE 2-2.08 GM-%(50ML) IV SOLR
2.0000 g | INTRAVENOUS | Status: AC
  Administered 2017-02-19: 2 g via INTRAVENOUS
  Filled 2017-02-19: qty 50

## 2017-02-19 MED ORDER — PROPOFOL 10 MG/ML IV BOLUS
INTRAVENOUS | Status: AC
  Filled 2017-02-19: qty 20

## 2017-02-19 MED ORDER — LIDOCAINE 2% (20 MG/ML) 5 ML SYRINGE
INTRAMUSCULAR | Status: AC
  Filled 2017-02-19: qty 10

## 2017-02-19 MED ORDER — ONDANSETRON HCL 4 MG PO TABS
4.0000 mg | ORAL_TABLET | Freq: Four times a day (QID) | ORAL | Status: DC | PRN
  Filled 2017-02-19: qty 1

## 2017-02-19 MED ORDER — SUGAMMADEX SODIUM 500 MG/5ML IV SOLN
INTRAVENOUS | Status: DC | PRN
  Administered 2017-02-19: 368.4 mg via INTRAVENOUS

## 2017-02-19 MED ORDER — KETOROLAC TROMETHAMINE 15 MG/ML IJ SOLN
INTRAMUSCULAR | Status: AC
  Filled 2017-02-19: qty 1

## 2017-02-19 MED ORDER — LIDOCAINE 2% (20 MG/ML) 5 ML SYRINGE
INTRAMUSCULAR | Status: DC | PRN
  Administered 2017-02-19 (×2): 50 mg via INTRAVENOUS

## 2017-02-19 MED ORDER — SUCCINYLCHOLINE CHLORIDE 200 MG/10ML IV SOSY
PREFILLED_SYRINGE | INTRAVENOUS | Status: DC | PRN
  Administered 2017-02-19: 120 mg via INTRAVENOUS

## 2017-02-19 MED ORDER — DEXAMETHASONE SODIUM PHOSPHATE 10 MG/ML IJ SOLN
INTRAMUSCULAR | Status: AC
  Filled 2017-02-19: qty 1

## 2017-02-19 MED ORDER — SUCCINYLCHOLINE CHLORIDE 200 MG/10ML IV SOSY
PREFILLED_SYRINGE | INTRAVENOUS | Status: AC
  Filled 2017-02-19: qty 10

## 2017-02-19 MED ORDER — ONDANSETRON HCL 4 MG/2ML IJ SOLN: 4.0000 mg | Freq: Four times a day (QID) | INTRAMUSCULAR | Status: DC | PRN

## 2017-02-19 SURGICAL SUPPLY — 47 items
ADH SKN CLS APL DERMABOND .7 (GAUZE/BANDAGES/DRESSINGS) ×2
APPLICATOR COTTON TIP 6IN STRL (MISCELLANEOUS) ×2 IMPLANT
BAG SPEC RTRVL LRG 6X4 10 (ENDOMECHANICALS) ×2
BLADE EXTENDED COATED 6.5IN (ELECTRODE) IMPLANT
BLADE HEX COATED 2.75 (ELECTRODE) ×4 IMPLANT
CHLORAPREP W/TINT 26ML (MISCELLANEOUS) ×4 IMPLANT
COVER MAYO STAND STRL (DRAPES) IMPLANT
COVER SURGICAL LIGHT HANDLE (MISCELLANEOUS) ×4 IMPLANT
CUTTER FLEX LINEAR 45M (STAPLE) ×2 IMPLANT
DERMABOND ADVANCED (GAUZE/BANDAGES/DRESSINGS) ×2
DERMABOND ADVANCED .7 DNX12 (GAUZE/BANDAGES/DRESSINGS) IMPLANT
DRAPE LAPAROSCOPIC ABDOMINAL (DRAPES) ×2 IMPLANT
DRAPE WARM FLUID 44X44 (DRAPE) ×2 IMPLANT
ELECT REM PT RETURN 15FT ADLT (MISCELLANEOUS) ×4 IMPLANT
GAUZE SPONGE 4X4 12PLY STRL (GAUZE/BANDAGES/DRESSINGS) ×2 IMPLANT
GLOVE BIOGEL PI IND STRL 7.0 (GLOVE) ×2 IMPLANT
GLOVE BIOGEL PI INDICATOR 7.0 (GLOVE) ×2
GLOVE SURG ORTHO 8.0 STRL STRW (GLOVE) ×4 IMPLANT
GOWN STRL REUS W/TWL LRG LVL3 (GOWN DISPOSABLE) ×4 IMPLANT
GOWN STRL REUS W/TWL XL LVL3 (GOWN DISPOSABLE) ×12 IMPLANT
HANDLE SUCTION POOLE (INSTRUMENTS) IMPLANT
KIT BASIN OR (CUSTOM PROCEDURE TRAY) ×4 IMPLANT
NS IRRIG 1000ML POUR BTL (IV SOLUTION) ×4 IMPLANT
PACK GENERAL/GYN (CUSTOM PROCEDURE TRAY) ×4 IMPLANT
POUCH SPECIMEN RETRIEVAL 10MM (ENDOMECHANICALS) ×2 IMPLANT
RELOAD STAPLE 45 3.5 BLU ETS (ENDOMECHANICALS) IMPLANT
RELOAD STAPLE TA45 3.5 REG BLU (ENDOMECHANICALS) ×4 IMPLANT
SLEEVE ADV FIXATION 5X100MM (TROCAR) ×4 IMPLANT
SPONGE LAP 18X18 X RAY DECT (DISPOSABLE) IMPLANT
STAPLER VISISTAT 35W (STAPLE) ×4 IMPLANT
SUCTION POOLE HANDLE (INSTRUMENTS)
SUT MNCRL AB 4-0 PS2 18 (SUTURE) ×2 IMPLANT
SUT NOV 1 T60/GS (SUTURE) IMPLANT
SUT SILK 2 0 (SUTURE)
SUT SILK 2 0 SH CR/8 (SUTURE) IMPLANT
SUT SILK 2-0 18XBRD TIE 12 (SUTURE) IMPLANT
SUT SILK 3 0 (SUTURE)
SUT SILK 3 0 SH CR/8 (SUTURE) IMPLANT
SUT SILK 3-0 18XBRD TIE 12 (SUTURE) IMPLANT
SUT VICRYL 2 0 18  UND BR (SUTURE)
SUT VICRYL 2 0 18 UND BR (SUTURE) IMPLANT
TOWEL OR 17X26 10 PK STRL BLUE (TOWEL DISPOSABLE) ×6 IMPLANT
TRAY FOLEY W/METER SILVER 16FR (SET/KITS/TRAYS/PACK) ×2 IMPLANT
TROCAR ADV FIXATION 5X100MM (TROCAR) ×2 IMPLANT
TROCAR XCEL BLUNT TIP 100MML (ENDOMECHANICALS) ×2 IMPLANT
WATER STERILE IRR 1000ML POUR (IV SOLUTION) ×2 IMPLANT
YANKAUER SUCT BULB TIP NO VENT (SUCTIONS) IMPLANT

## 2017-02-19 NOTE — Transfer of Care (Signed)
Immediate Anesthesia Transfer of Care Note  Patient: Andre Padilla  Procedure(s) Performed: Procedure(s): EXPLORATORY LAPAROSCOPY LADD'S PROCEDURE WITH APPENDECTOMY (N/A) APPENDECTOMY (N/A)  Patient Location: PACU  Anesthesia Type:General  Level of Consciousness: Patient easily awoken, sedated, comfortable, cooperative, following commands, responds to stimulation.   Airway & Oxygen Therapy: Patient spontaneously breathing, ventilating well, oxygen via simple oxygen mask.  Post-op Assessment: Report given to PACU RN, vital signs reviewed and stable, moving all extremities.   Post vital signs: Reviewed and stable.  Complications: No apparent anesthesia complications  Last Vitals:  Vitals:   02/19/17 0623  BP: 133/80  Pulse: 67  Resp: 18  Temp: 36.8 C  SpO2: 100%    Last Pain:  Vitals:   02/19/17 0640  TempSrc:   PainSc: 8       Patients Stated Pain Goal: 5 (40/97/35 3299)  Complications: No apparent anesthesia complications

## 2017-02-19 NOTE — Anesthesia Procedure Notes (Signed)
Procedure Name: Intubation Date/Time: 02/19/2017 8:51 AM Performed by: Deliah Boston, CRNA Pre-anesthesia Checklist: Patient identified, Emergency Drugs available, Suction available and Patient being monitored Patient Re-evaluated:Patient Re-evaluated prior to induction Oxygen Delivery Method: Circle system utilized Preoxygenation: Pre-oxygenation with 100% oxygen Induction Type: IV induction, Rapid sequence and Cricoid Pressure applied Laryngoscope Size: Mac and 4 Grade View: Grade I Tube type: Oral Tube size: 7.5 mm Number of attempts: 1 Airway Equipment and Method: Stylet and Oral airway Placement Confirmation: ETT inserted through vocal cords under direct vision,  positive ETCO2 and breath sounds checked- equal and bilateral Secured at: 23 cm Tube secured with: Tape Dental Injury: Teeth and Oropharynx as per pre-operative assessment

## 2017-02-19 NOTE — Anesthesia Postprocedure Evaluation (Signed)
Anesthesia Post Note  Patient: Andre Padilla  Procedure(s) Performed: EXPLORATORY LAPAROSCOPY LADD'S PROCEDURE WITH APPENDECTOMY (N/A Abdomen) APPENDECTOMY (N/A )     Patient location during evaluation: PACU Anesthesia Type: General Level of consciousness: awake Pain management: pain level controlled Vital Signs Assessment: post-procedure vital signs reviewed and stable Respiratory status: spontaneous breathing Cardiovascular status: stable Anesthetic complications: no    Last Vitals:  Vitals:   02/19/17 1349 02/19/17 1500  BP: 132/73 122/75  Pulse: 69 63  Resp: 16 17  Temp: 36.7 C 37 C  SpO2:  100%    Last Pain:  Vitals:   02/19/17 1540  TempSrc:   PainSc: Asleep                 Loyce Klasen

## 2017-02-19 NOTE — Anesthesia Preprocedure Evaluation (Addendum)
Anesthesia Evaluation  Patient identified by MRN, date of birth, ID band Patient awake    Reviewed: Allergy & Precautions, NPO status , Patient's Chart, lab work & pertinent test results  Airway Mallampati: II  TM Distance: >3 FB     Dental   Pulmonary neg pulmonary ROS, Current Smoker,    breath sounds clear to auscultation       Cardiovascular negative cardio ROS   Rhythm:Regular Rate:Normal     Neuro/Psych    GI/Hepatic Neg liver ROS, History noted   Endo/Other    Renal/GU negative Renal ROS     Musculoskeletal   Abdominal   Peds  Hematology   Anesthesia Other Findings   Reproductive/Obstetrics                             Anesthesia Physical Anesthesia Plan  ASA: II  Anesthesia Plan: General   Post-op Pain Management:    Induction: Intravenous  PONV Risk Score and Plan: 1 and Treatment may vary due to age or medical condition, Ondansetron, Dexamethasone and Midazolam  Airway Management Planned: Oral ETT  Additional Equipment:   Intra-op Plan:   Post-operative Plan: Possible Post-op intubation/ventilation  Informed Consent: I have reviewed the patients History and Physical, chart, labs and discussed the procedure including the risks, benefits and alternatives for the proposed anesthesia with the patient or authorized representative who has indicated his/her understanding and acceptance.   Dental advisory given  Plan Discussed with: CRNA and Anesthesiologist  Anesthesia Plan Comments:         Anesthesia Quick Evaluation

## 2017-02-19 NOTE — Op Note (Signed)
02/19/2017  10:13 AM  PATIENT:  Andre Padilla  32 y.o. male  Patient Care Team: [facility] as PCP - General (Internal Medicine)  PRE-OPERATIVE DIAGNOSIS:  MALROTATION  POST-OPERATIVE DIAGNOSIS:  malrotation of gut  PROCEDURE:  1. Laparoscopic Ladd's Procedure 2. Appendectomy  SURGEON:  Sharon Mt. Aria Jarrard, MD  ASSISTANT: Leighton Ruff, MD - An MD assistant was necessary for complex exposure  ANESTHESIA:   general  COUNTS:  Sponge, needle and instrument counts were reported correct x2 at the conclusion of the operation.  EBL: 10cc  DRAINS: None  SPECIMEN: Appendix  COMPLICATIONS: None  FINDINGS: Partial malrotation anatomy identified. Cecum Ladd's bands in the right upper quadrant between small bowel/ileocolic mesentery and the abdominal wall overlying the duodenum. All bands were lysed. The bowel and associated mesentery was run from the duodenum to the peritoneal reflection of the rectum and no additional Ladd's bands were identified. Appendectomy performed. At conclusion of the case the small bowel was placed residing in the right abdomen and the colon in the left abdomen. The mesentery was inspected and no twisting was noted.  DISPOSITION: PACU in satisfactory condition  INDICATION: Andre Padilla is a very pleasant 32yo gentleman here today for evaluation of possible intestinal malrotation. He has a 65yr hx of intermittent crampy abdominal pain - occurring multiple times per week and occurs after eating meals. +nausea and bloating, rare emesis. Often has diarrhea and complains that immediately after eating everything passes right through him. He had a CT performed 02/2016 to further evaluate which demonstrated no acute abnormality but did note intestinal malrotation with the colon in the left hemiabdomen, ICV in LLQ and duodeno-jejunal junction being located in the right upper quadrant. He has been seen by GI as well and referred here for further evaluation. Reports 20-30lb  wt loss in the last year.  He saw in the office 12/16/16 and obtained an upper GI on 01/02/17. This showed a normal-appearing esophagus and stomach as well as duodenal bulb. There was malrotation of the small bowel and colon without evidence of obstruction.  The anatomy and physiology of the GI tract was discussed at length with the patient. The pathophysiology of malrotation was discussed at length with associated pictures and illustrations. We discussed the open technique for addressing his problem and rationale for surgery. We discussed that his symptoms will likely improve with the procedure but may not completely resolve. We discussed the operative approach including laparoscopic and open techniques. The planned procedure, material risks (including, but not limited to, pain, bleeding, infection, scarring, need for blood transfusion, damage to surrounding structures- blood vessels/nerves/viscus/organs, damage to ureter, need for additional procedures, hernia, pneumonia, heart attack, stroke, death) benefits and alternatives to surgery were discussed at length. I noted a good probability that the procedure would help improve his symptoms. The patient and his Aunt's questions were answered to their satisfaction and they elected to proceed with surgery.  DESCRIPTION: The patient was identified in preop holding and taken to the OR where he was placed on the operating room table and SCDs were placed. General endotracheal anesthesia was induced without difficulty. The patient was then prepped and draped in the usual sterile fashion. A surgical timeout was performed indicating the correct patient, procedure, positioning and need for preoperative antibiotics.   Local anesthetic was infiltrated into the supraumbilical skin and subcutaneous tissue. A supraumbilical incision was made and dissection carried down to the umbilical stalk. The stalk was grasped with a Kocher clamp and retracted outwardly. The  supraumbilical fascia was incised and peritoneal entry was gently made bluntly. An 0 vicryl figure-of-eight suture was placed. A Hasson cannula was placed and insufflation to a maximum pressure of 41mmHg commenced. A laparoscope was placed and inspection revealed no evidence of trocar site complications. A TAP block was performed along the left abdominal wall using dilute exparel+0.25% marcaine. 2 additional 42mm ports were placed in the left abdomen. The patient was positioned in reverse trendelenburg and the right upper quadrant inspected. The pylorus and duodenum were identified. There were overlying Ladd's bands from the small bowel mesentery over the 2nd portion of the duodenum which were compressing it. These were lysed sharply. After all Ladd's bands over the duodenum had been lysed, the bowel and associated mesentery was run from the duodenum to the peritoneal reflection of the rectum. No additional bands were found including within the mesentery. The cecum and right colon were to the left of the midline. The appendix was identified and elevated with a grasper. A window was then created in the appendiceal mesentery at its base. The appendiceal mesentery was ligated with the laparoscopic Enseal. The appendix was then divided at its base with an EndoGIA blue load including a very small cuff of cecum. The appendix was placed in an endocatch bag. The bowel was then re-run and the mesentery re-visualized. The small bowel and mesentery were placed in the right abdomen and the colon in the left abdomen. The mesentery was inspected to ensure no twisting and none was noted. At this point all trocars were removed under visualization and the endocatch bag removed from the abdominal cavity. The appendix passed off as specimen. The figure-of-eight 0 vicryl suture was tied and the fascia inspected and noted to be appropriately closed. The skin of all incision sites was approximated with 4-0 monocryl subcuticular sutures  and dermabond applied. The patient was then awakened from general anesthesia, extubated and transferred to a stretcher for transport to PACU in satisfactory condition.   Sharon Mt. Dema Severin, M.D. [facility]

## 2017-02-19 NOTE — H&P (Signed)
H&P Update  H&P from 01/27/17 reviewed with the patient. He reports no interval changes to his health or history. Vitals:   02/19/17 0623 02/19/17 0640  BP: 133/80   Pulse: 67   Resp: 18   Temp: 98.3 F (36.8 C)   TempSrc: Oral   SpO2: 100%   Weight:  92.1 kg (203 lb)  Height:  5\' 11"  (1.803 m)   PLAN -OR today for laparoscopic vs open Ladd's procedure with appendectomy; all other indicated procedures. -The planned procedure, material risks (including, but not limited to, pain, bleeding, infection, scarring, need for blood transfusion, damage to surrounding structures- blood vessels/nerves/viscus/organs, damage to ureter, need for additional procedures, recurrence, failure to completely resolve abdominal symptoms, hernia, pneumonia, heart attack, stroke, death) benefits and alternatives to surgery were discussed at length. I noted a good probability that the procedure would help improve his symptoms. His questions were answered to his satisfaction and he elected to proceed with surgery.  Sharon Mt. Dema Severin, M.D. General and Colorectal Surgery [facility]

## 2017-02-20 DIAGNOSIS — D72828 Other elevated white blood cell count: Secondary | ICD-10-CM | POA: Diagnosis present

## 2017-02-20 DIAGNOSIS — F17200 Nicotine dependence, unspecified, uncomplicated: Secondary | ICD-10-CM | POA: Diagnosis present

## 2017-02-20 DIAGNOSIS — Q433 Congenital malformations of intestinal fixation: Secondary | ICD-10-CM | POA: Diagnosis present

## 2017-02-20 DIAGNOSIS — Z23 Encounter for immunization: Secondary | ICD-10-CM | POA: Diagnosis not present

## 2017-02-20 LAB — CBC
HEMATOCRIT: 38.2 % — AB (ref 39.0–52.0)
Hemoglobin: 12.6 g/dL — ABNORMAL LOW (ref 13.0–17.0)
MCH: 21.7 pg — ABNORMAL LOW (ref 26.0–34.0)
MCHC: 33 g/dL (ref 30.0–36.0)
MCV: 65.9 fL — AB (ref 78.0–100.0)
Platelets: 267 10*3/uL (ref 150–400)
RBC: 5.8 MIL/uL (ref 4.22–5.81)
RDW: 16 % — AB (ref 11.5–15.5)
WBC: 18.4 10*3/uL — AB (ref 4.0–10.5)

## 2017-02-20 LAB — BASIC METABOLIC PANEL
Anion gap: 6 (ref 5–15)
BUN: 13 mg/dL (ref 6–20)
CO2: 25 mmol/L (ref 22–32)
CREATININE: 0.85 mg/dL (ref 0.61–1.24)
Calcium: 8.7 mg/dL — ABNORMAL LOW (ref 8.9–10.3)
Chloride: 104 mmol/L (ref 101–111)
GFR calc Af Amer: >60 mL/min (ref >60)
Glucose, Bld: 105 mg/dL — ABNORMAL HIGH (ref 65–99)
POTASSIUM: 4.2 mmol/L (ref 3.5–5.1)
SODIUM: 135 mmol/L (ref 135–145)

## 2017-02-20 NOTE — Progress Notes (Signed)
Subjective No acute events. Ambulating x4 since surgery. Tolerating diet without n/v.  Objective: Vital signs in last 24 hours: Temp:  [97.5 F (36.4 C)-98.7 F (37.1 C)] 97.5 F (36.4 C) (02/07 0514) Pulse Rate:  [53-79] 53 (02/07 0514) Resp:  [12-18] 15 (02/07 0514) BP: (103-153)/(54-82) 117/68 (02/07 0514) SpO2:  [99 %-100 %] 100 % (02/07 0514) Last BM Date: 02/18/17  Intake/Output from previous day: 02/06 0701 - 02/07 0700 In: 4255 [P.O.:1500; I.V.:2755] Out: 1235 [Urine:1225; Blood:10] Intake/Output this shift: No intake/output data recorded.  Gen: NAD, comfortable CV: RRR Pulm: Normal work of breathing Abd: Soft, appropriately ttp around incisions but no tenderness along right abdomen or laterally. Nondistended. Incisions c/d/i without erythema Ext: SCDs in place  Lab Results: CBC  Recent Labs    02/18/17 1435 02/20/17 0552  WBC 8.8 18.4*  HGB 14.1 12.6*  HCT 43.7 38.2*  PLT 298 267   BMET Recent Labs    02/18/17 1435 02/20/17 0552  NA 138 135  K 3.9 4.2  CL 105 104  CO2 24 25  GLUCOSE 64* 105*  BUN 11 13  CREATININE 0.92 0.85  CALCIUM 9.0 8.7*   PT/INR Recent Labs    02/18/17 1435  LABPROT 13.2  INR 1.01   ABG No results for input(s): PHART, HCO3 in the last 72 hours.  Invalid input(s): PCO2, PO2  Studies/Results:  Anti-infectives: Anti-infectives (From admission, onward)   Start     Dose/Rate Route Frequency Ordered Stop   02/19/17 0621  cefoTEtan in Dextrose 5% (CEFOTAN) IVPB 2 g     2 g Intravenous On call to O.R. 02/19/17 0621 02/19/17 0853       Assessment/Plan: Patient Active Problem List   Diagnosis Date Noted  . 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 Dema Severin, M.D. General and Colorectal Surgery [facility]

## 2017-02-21 LAB — CBC WITH DIFFERENTIAL/PLATELET
BASOS PCT: 1 %
Basophils Absolute: 0.1 10*3/uL (ref 0.0–0.1)
EOS ABS: 0.1 10*3/uL (ref 0.0–0.7)
Eosinophils Relative: 1 %
HEMATOCRIT: 39.8 % (ref 39.0–52.0)
HEMOGLOBIN: 13 g/dL (ref 13.0–17.0)
LYMPHS PCT: 38 %
Lymphs Abs: 4.1 10*3/uL — ABNORMAL HIGH (ref 0.7–4.0)
MCH: 21.5 pg — AB (ref 26.0–34.0)
MCHC: 32.7 g/dL (ref 30.0–36.0)
MCV: 65.8 fL — ABNORMAL LOW (ref 78.0–100.0)
MONOS PCT: 9 %
Monocytes Absolute: 1 10*3/uL (ref 0.1–1.0)
NEUTROS ABS: 5.5 10*3/uL (ref 1.7–7.7)
Neutrophils Relative %: 51 %
Platelets: 259 10*3/uL (ref 150–400)
RBC: 6.05 MIL/uL — ABNORMAL HIGH (ref 4.22–5.81)
RDW: 16.1 % — ABNORMAL HIGH (ref 11.5–15.5)
WBC: 10.8 10*3/uL — ABNORMAL HIGH (ref 4.0–10.5)

## 2017-02-21 MED ORDER — TRAMADOL HCL 50 MG PO TABS: 50.0000 mg | ORAL_TABLET | Freq: Four times a day (QID) | ORAL | 0 refills | Status: AC | PRN

## 2017-02-21 NOTE — Progress Notes (Signed)
Pt alert, oriented, ambulating in hallway, and tolerating his diet.  D/C instructions and prescription was given to him and his aunt.  All questions were answered.

## 2017-02-21 NOTE — Discharge Instructions (Signed)
POST OP INSTRUCTIONS  1. DIET: As tolerated. Follow a light bland diet the first 24 hours after arrival home, such as soup, liquids, crackers, etc.  Be sure to include lots of fluids daily.  Avoid fast food or heavy meals as your are more likely to get nauseated.  Eat a low fat the next few days after surgery.  2. Take your usually prescribed home medications unless otherwise directed.  3. PAIN CONTROL: a. Pain is best controlled by a usual combination of three different methods TOGETHER: i. Ice/Heat ii. Over the counter pain medication iii. Prescription pain medication b. Most patients will experience some swelling and bruising around the surgical site.  Ice packs or heating pads (30-60 minutes up to 6 times a day) will help. Some people prefer to use ice alone, heat alone, alternating between ice & heat.  Experiment to what works for you.  Swelling and bruising can take several weeks to resolve.   c. It is helpful to take an over-the-counter pain medication regularly for the first few weeks: i. Ibuprofen (Motrin/Advil) - 200mg  tabs - take 3 tabs (600mg ) every 6 hours as needed for pain ii. Acetaminophen (Tylenol) - you may take 650mg  every 6 hours as needed. You can take this with motrin as they act differently on the body. If you are taking a narcotic pain medication that has acetaminophen in it, do not take over the counter tylenol at the same time.  Iii. NOTE: You may take both of these medications together - most patients  find it most helpful when alternating between the two (i.e. Ibuprofen at 6am,  tylenol at 9am, ibuprofen at 12pm ...) d. A  prescription for pain medication should be given to you upon discharge.  Take your pain medication as prescribed if your pain is not adequatly controlled with the over-the-counter pain reliefs mentioned above.  4. Avoid getting constipated.  Between the surgery and the pain medications, it is common to experience some constipation.  Increasing fluid  intake and taking a fiber supplement (such as Metamucil, Citrucel, FiberCon, MiraLax, etc) 1-2 times a day regularly will usually help prevent this problem from occurring.  A mild laxative (prune juice, Milk of Magnesia, MiraLax, etc) should be taken according to package directions if there are no bowel movements after 48 hours.    5. Dressing: Your incision is covered in Dermabond which is like sterile superglue for the skin. This will come off on it's own in a couple weeks. It is waterproof and you may bathe normally starting the day after your surgery in a shower. Avoid baths/pools/lakes/oceans until your wounds have fully healed.  6. ACTIVITIES as tolerated:   a. Avoid heavy lifting (>10lbs or 1 gallon of milk) for the next 6 weeks. b. You may resume regular (light) daily activities beginning the next day--such as daily self-care, walking, climbing stairs--gradually increasing activities as tolerated.  If you can walk 30 minutes without difficulty, it is safe to try more intense activity such as jogging, treadmill, bicycling, low-impact aerobics.  c. DO NOT PUSH THROUGH PAIN.  Let pain be your guide: If it hurts to do something, don't do it. d. Dennis Bast may drive when you are no longer taking prescription pain medication, you can comfortably wear a seatbelt, and you can safely maneuver your car and apply brakes. e. Dennis Bast may have sexual intercourse when it is comfortable.   7. FOLLOW UP in our office a. Please call [facility] at [phone] to set up an appointment  to see your surgeon in the office for a follow-up appointment approximately 2 weeks after your surgery. b. Make sure that you call for this appointment the day you arrive home to insure a convenient appointment time.  9. If you have disability or family leave forms that need to be completed, you may have them completed by your primary care physician's office; for return to work instructions, please ask our office staff and they will be happy to  assist you in obtaining this documentation   When to call us [phone]: 1. Poor pain control 2. Reactions / problems with new medications (rash/itching, etc)  3. Fever over 101.5 F (38.5 C) 4. Inability to urinate 5. Nausea/vomiting 6. Worsening swelling or bruising 7. Continued bleeding from incision. 8. Increased pain, redness, or drainage from the incision  The clinic staff is available to answer your questions during regular business hours (8:30am-5pm).  Please dont hesitate to call and ask to speak to one of our nurses for clinical concerns.   A surgeon from [facility] is always on call at the hospitals   If you have a medical emergency, go to the nearest emergency room or call 911.  [facility], [place] [address] [place], [place], [place]  [address] MAIN: [phone] FAX: [phone] [email or web address]

## 2017-02-21 NOTE — Discharge Summary (Signed)
Patient ID: BARNIE SOPKO MRN: [id number] DOB/AGE: 13-Apr-1985 32 y.o.  Admit date: 02/19/2017 Discharge date: 02/21/2017  Discharge Diagnoses Patient Active Problem List   Diagnosis Date Noted  . 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 Lammert was admitted to the hospital postoperatively for monitoring. On POD#1, he had a likely reactive leukocytosis but remained afebrile with normal vitals. He began having bowel function on POD#1 and was tolerating a regular diet without n/v. On POD#2, he WBC was downtrending and ~normalized. He continued to have bowel function and was tolerating a diet with normal vitals. His pain was controlled on oral analgesics and he was deemed stable for discharge home.  Vitals:   02/20/17 0700 02/20/17 1458 02/20/17 2208 02/21/17 0522  BP:  132/71 125/69 119/72  Pulse:  65 62 (!) 58  Resp:  16 17 17   Temp:  97.8 F (36.6 C) 98.2 F (36.8 C) 97.9 F (36.6 C)  TempSrc:  Oral Oral Oral  SpO2:  100% 100% 100%  Weight: 94.4 kg (208 lb 1.8 oz)   94.6 kg (208 lb 8.9 oz)  Height:       Gen: NAD, comfortable eating breakfast CV: RRR Abd: Soft, NT/ND; incisions c/d/i without erythema; some nonblanching ecchymoses to umbilical incision    Allergies as of 02/21/2017   No Known Allergies     Medication List    TAKE these medications   dicyclomine 20 MG tablet Commonly known as:  BENTYL Take 1 tableet by mouth 30 min before meals.   famotidine 20 MG tablet Commonly known as:  PEPCID Take 1 tablet (20 mg total) by mouth 2 (two) times daily.   omeprazole 20 MG capsule Commonly known as:  PRILOSEC Take 1 tablet by mouth every morning.   ondansetron 4 MG tablet Commonly known as:  ZOFRAN Take 1 tablet (4 mg total) 2 (two) times daily by mouth.   promethazine 50 MG suppository Commonly known as:  PHENERGAN Place 1 suppository (50 mg total) 2 (two)  times daily rectally.   sucralfate 1 g tablet Commonly known as:  CARAFATE Take 1 tablet (1 g total) by mouth 4 (four) times daily -  with meals and at bedtime.   traMADol 50 MG tablet Commonly known as:  ULTRAM Take 1 tablet (50 mg total) by mouth every 6 (six) hours as needed for up to 5 days.        Sharon Mt. Dema Severin, M.D. [facility]

## 2017-04-01 ENCOUNTER — Encounter: Payer: Self-pay | Admitting: Physician Assistant

## 2017-04-01 ENCOUNTER — Ambulatory Visit: Payer: Medicaid Other | Admitting: Physician Assistant

## 2017-04-01 ENCOUNTER — Encounter (INDEPENDENT_AMBULATORY_CARE_PROVIDER_SITE_OTHER): Payer: Self-pay

## 2017-04-01 ENCOUNTER — Other Ambulatory Visit (INDEPENDENT_AMBULATORY_CARE_PROVIDER_SITE_OTHER): Payer: Medicaid Other

## 2017-04-01 VITALS — BP 100/70 | HR 76 | Ht 71.0 in | Wt 203.4 lb

## 2017-04-01 DIAGNOSIS — R1012 Left upper quadrant pain: Secondary | ICD-10-CM | POA: Diagnosis not present

## 2017-04-01 DIAGNOSIS — R112 Nausea with vomiting, unspecified: Secondary | ICD-10-CM

## 2017-04-01 DIAGNOSIS — Q433 Congenital malformations of intestinal fixation: Secondary | ICD-10-CM

## 2017-04-01 LAB — BASIC METABOLIC PANEL
BUN: 10 mg/dL (ref 6–23)
CALCIUM: 9.6 mg/dL (ref 8.4–10.5)
CO2: 30 mEq/L (ref 19–32)
Chloride: 103 mEq/L (ref 96–112)
Creatinine, Ser: 0.93 mg/dL (ref 0.40–1.50)
GFR: 123.1 mL/min (ref >60.00)
Glucose, Bld: 92 mg/dL (ref 70–99)
Potassium: 4 mEq/L (ref 3.5–5.1)
SODIUM: 138 meq/L (ref 135–145)

## 2017-04-01 LAB — CBC WITH DIFFERENTIAL/PLATELET
BASOS ABS: 0.1 10*3/uL (ref 0.0–0.1)
Basophils Relative: 0.6 % (ref 0.0–3.0)
Eosinophils Absolute: 0.1 10*3/uL (ref 0.0–0.7)
Eosinophils Relative: 1.1 % (ref 0.0–5.0)
HEMATOCRIT: 43.4 % (ref 39.0–52.0)
HEMOGLOBIN: 14 g/dL (ref 13.0–17.0)
LYMPHS PCT: 34.8 % (ref 12.0–46.0)
Lymphs Abs: 3.6 10*3/uL (ref 0.7–4.0)
MCHC: 32.3 g/dL (ref 30.0–36.0)
MONOS PCT: 5.8 % (ref 3.0–12.0)
Monocytes Absolute: 0.6 10*3/uL (ref 0.1–1.0)
Neutro Abs: 5.9 10*3/uL (ref 1.4–7.7)
Neutrophils Relative %: 57.7 % (ref 43.0–77.0)
Platelets: 285 10*3/uL (ref 150.0–400.0)
RBC: 6.49 Mil/uL — ABNORMAL HIGH (ref 4.22–5.81)
RDW: 16.1 % — ABNORMAL HIGH (ref 11.5–15.5)
WBC: 10.3 10*3/uL (ref 4.0–10.5)

## 2017-04-01 MED ORDER — ONDANSETRON 4 MG PO TBDP: ORAL_TABLET | ORAL | 2 refills | Status: AC

## 2017-04-01 NOTE — Patient Instructions (Signed)
You have been scheduled for a CT scan of the abdomen and pelvis at [facility] ([address]---this is in the same building as [facility]).   You are scheduled on 04/03/17 at 4:00pm. You should arrive 15 minutes prior to your appointment time for registration. Please follow the written instructions below on the day of your exam:  WARNING: IF YOU ARE ALLERGIC TO IODINE/X-RAY DYE, PLEASE NOTIFY RADIOLOGY IMMEDIATELY AT [phone]! YOU WILL BE GIVEN A 13 HOUR PREMEDICATION PREP.  1) Do not eat or drink anything after 12:00pm (4 hours prior to your test) 2) You have been given 2 bottles of oral contrast to drink. The solution may taste               better if refrigerated, but do NOT add ice or any other liquid to this solution. Shake             well before drinking.    Drink 1 bottle of contrast @ 2:00pm (2 hours prior to your exam)  Drink 1 bottle of contrast @ 3:00pm (1 hour prior to your exam)  You may take any medications as prescribed with a small amount of water except for the following: Metformin, Glucophage, Glucovance, Avandamet, Riomet, Fortamet, Actoplus Met, Janumet, Glumetza or Metaglip. The above medications must be held the day of the exam AND 48 hours after the exam.  The purpose of you drinking the oral contrast is to aid in the visualization of your intestinal tract. The contrast solution may cause some diarrhea. Before your exam is started, you will be given a small amount of fluid to drink. Depending on your individual set of symptoms, you may also receive an intravenous injection of x-ray contrast/dye. Plan on being at [facility] for 30 minutes or longer, depending on the type of exam you are having performed.  This test typically takes 30-45 minutes to complete.  If you have any questions regarding your exam or if you need to reschedule, you may call the CT department at [phone] between the hours of 8:00 am and 5:00 pm, Monday-Friday.  We  have sent the following medications to your pharmacy for you to pick up at your convenience:  Normal BMI (Body Mass Index- based on height and weight) is between 19 and 25. Your BMI today is Body mass index is 28.37 kg/m. Marland Kitchen Please consider follow up  regarding your BMI with your Primary Care Provider.   Your physician has requested that you go to the basement for the following lab work before leaving today:   ________________________________________________________________________

## 2017-04-01 NOTE — Progress Notes (Signed)
Subjective:    Patient ID: Andre Padilla, male    DOB: April 14, 1985, 32 y.o.   MRN: [id number]  HPI Andre Padilla is a 32 year old African-American male known to Dr. Ardis Hughs and myself. He was last seen in our office in October 2018 with complaints of1-1/2 year history of mid abdominal pain episodes occurring very frequently with associated nausea vomiting and weight loss. He also had complained of sensation of early satiety and cramping and urgency for bowel movements postprandially. He underwent upper endoscopy in November 2018 which was normal.He had had CT of the abdomen and pelvis done February 2018 which showed intestinal malrotation with the colon in the left hemi abdomen and the ileocecal valve in the left lower quadrant and duodenum and jejunal junction in the right upper quadrant there was no evidence of volvulus or obstruction at that time. He was subsequently referred to surgery/Dr. Dema Severin and underwent upper GI on 01/02/2017 showed malrotation of the colon and small bowel With the duodenum and proximal jejunum completely to the right of midline, no obstruction. He underwent surgery on 02/19/2017 with laparoscopic appendectomy and Ladds procedure with lysis of Ladds bands. Patient says he did well for a few days after surgery but shortly thereafter had recurrence of mostly left-sided abdominal pain which she says has been very uncomfortable over the past few weeks. He says he is unable to lay on his left side at night and has to lay on the right side to help alleviate the discomfort.He is having recurrent episodes of vomiting and says once she starts having nausea and vomiting he'll have multiple episodes over that day, gradually feel better and then into his 3 days the process start over again. He is also back to having intermittent postprandial urgency when he has been able to eat.  He says he did see Dr. Hulen Skains in his office yesterday who mentioned ordering x-rays but the patient does not believe he  is been scheduled for anything.   Review of Systems Pertinent positive and negative review of systems were noted in the above HPI section.  All other review of systems was otherwise negative.  Outpatient Encounter Medications as of 04/01/2017  Medication Sig  . dicyclomine (BENTYL) 20 MG tablet Take 1 tableet by mouth 30 min before meals. (Patient not taking: Reported on 02/07/2017)  . famotidine (PEPCID) 20 MG tablet Take 1 tablet (20 mg total) by mouth 2 (two) times daily. (Patient not taking: Reported on 11/18/2016)  . omeprazole (PRILOSEC) 20 MG capsule Take 1 tablet by mouth every morning. (Patient not taking: Reported on 02/07/2017)  . ondansetron (ZOFRAN ODT) 4 MG disintegrating tablet zofran 4 mg every 6 hours as needed for n/v  . ondansetron (ZOFRAN) 4 MG tablet Take 1 tablet (4 mg total) 2 (two) times daily by mouth. (Patient not taking: Reported on 02/07/2017)  . promethazine (PHENERGAN) 50 MG suppository Place 1 suppository (50 mg total) 2 (two) times daily rectally. (Patient not taking: Reported on 02/07/2017)  . sucralfate (CARAFATE) 1 g tablet Take 1 tablet (1 g total) by mouth 4 (four) times daily -  with meals and at bedtime. (Patient not taking: Reported on 02/07/2017)   No facility-administered encounter medications on file as of 04/01/2017.    No Known Allergies Patient Active Problem List   Diagnosis Date Noted  . 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 Marital status: Single    Spouse name: Not on file  . Number of children: Not  on file  . Years of education: Not on file  . Highest education level: Not on file  Social Needs  . Financial resource strain: Not on file  . Food insecurity - worry: Not on file  . Food insecurity - inability: Not on file  . Transportation needs - medical: Not on file  . Transportation needs - non-medical: Not on file  Occupational History  . Not on file  Tobacco Use  . Smoking status: Current Every  Day Smoker    Packs/day: 0.15    Types: Cigarettes  . Smokeless tobacco: Never Used  Substance and Sexual Activity  . Alcohol use: No  . Drug use: No  . Sexual activity: Not on file  Other Topics Concern  . Not on file  Social History Narrative  . Not on file    Andre Padilla's family history includes Asthma in his mother; Breast cancer in his maternal grandmother; Hypertension in his maternal aunt, maternal uncle, and mother.      Objective:    Vitals:   04/01/17 0927  BP: 100/70  Pulse: 76    Physical Exam;well-developed young African-American male in no acute distress, accompanied by his father blood pressure 100/70 pulse 76, height 5 foot 11, weight 203, BMI 28.3. HEENT ;nontraumatic normocephalic EOMI PERRLA sclera anicteric, Cardiovascular; regular rate and rhythm with S1-S2 no murmur or gallop, Pulmonary;; bilaterally, Abdomen soft, bowel sounds are present he is quite tender in the left mid abdomen, no rebound no palpable mass or hepatosplenomegaly, he also has milder fairly diffuse tenderness incisional ports well-healed, Rectal ;exam not done, Extremities; no clubbing cyanosis or edema skin warm and dry, Neuropsych mood and affect appropria       Assessment & Plan:   #25 32 year old African-American male with history of intestinal malrotation who had presented to [place] with 1 year history of recurrent episodes of abdominal pain nausea,and vomiting, and intermittent postprandial urgency.This had been associated with significant weight loss Patient is now status post laparoscopic appendectomy and a Ladd's procedure on 02/19/2017 Pt  has had recurrence of very similar symptoms over the past 2 weeks and is complaining of ongoing pain in his left mid abdomen. He is having episodes of vomiting occurring every couple of days.  Etiology of current symptoms is not clear. Rule out intermittent volvulus/obstruction/recurrent malrotation  Plan;CBC, Bmet today Schedule for CT of the  abdomen and pelvis with contrast this week Encouraged him to push liquids and stay on a clear to full liquid diet- gradual advancement on the days that he has episodes of vomiting Zofran 4 mg ODT every 6 hours on a when necessary basis Further plans pending results of CT, he does have follow-up scheduled with Dr. Dema Severin early April.  Amy S Esterwood PA-C 04/01/2017   Cc: [place]

## 2017-04-01 NOTE — Progress Notes (Signed)
I agree with the above note, plan 

## 2017-04-03 ENCOUNTER — Ambulatory Visit (INDEPENDENT_AMBULATORY_CARE_PROVIDER_SITE_OTHER)
Admission: RE | Admit: 2017-04-03 | Discharge: 2017-04-03 | Disposition: A | Discharge status: Home / Self Care | Payer: Medicaid Other | Source: Ambulatory Visit | Attending: Physician Assistant | Admitting: Physician Assistant

## 2017-04-03 DIAGNOSIS — R1012 Left upper quadrant pain: Secondary | ICD-10-CM | POA: Diagnosis not present

## 2017-04-03 MED ORDER — IOPAMIDOL (ISOVUE-300) INJECTION 61%
100.0000 mL | Freq: Once | INTRAVENOUS | Status: AC | PRN
  Administered 2017-04-03: 100 mL via INTRAVENOUS

## 2019-02-01 ENCOUNTER — Ambulatory Visit: Payer: Medicaid Other | Attending: Internal Medicine

## 2019-02-01 DIAGNOSIS — Z20822 Contact with and (suspected) exposure to covid-19: Secondary | ICD-10-CM

## 2019-02-02 LAB — NOVEL CORONAVIRUS, NAA: SARS-CoV-2, NAA: DETECTED — AB

## 2020-01-19 ENCOUNTER — Ambulatory Visit: Payer: Medicaid Other | Admitting: Podiatry

## 2020-02-07 ENCOUNTER — Ambulatory Visit: Payer: Medicaid Other | Admitting: Podiatry

## 2020-02-07 ENCOUNTER — Other Ambulatory Visit: Payer: Self-pay

## 2020-02-07 DIAGNOSIS — B070 Plantar wart: Secondary | ICD-10-CM

## 2020-02-07 DIAGNOSIS — D492 Neoplasm of unspecified behavior of bone, soft tissue, and skin: Secondary | ICD-10-CM | POA: Diagnosis not present

## 2020-02-21 NOTE — Progress Notes (Signed)
   Subjective: 35 y.o. male presenting today as a new patient for evaluation of bilateral foot pain is been going on for several years.  He states that he has been dealing with plantar warts for a long period of time intermittently.  He has not done anything for treatment.  He presents for further treatment evaluation    Past Medical History:  Diagnosis Date  . Abdominal pain    SHARP PAIN   . Lead poisoning   . 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   Dermatology: Hyperkeratotic skin lesions noted to the plantar aspect of the bilateral feet approximately 1 cm in diameter. Pinpoint bleeding noted upon debridement. Skin is warm, dry and supple bilateral lower extremities. Negative for open lesions or macerations.   Vascular: Palpable pedal pulses bilaterally. No edema or erythema noted. Capillary refill within normal limits.   Neurological: Epicritic and protective threshold grossly intact bilaterally.    Musculoskeletal Exam: Pain on palpation to the noted skin lesions.  Range of motion within normal limits to all pedal and ankle joints bilateral. Muscle strength 5/5 in all groups bilateral.    Assessment: 1. plantar wart bilateral feet    Plan of Care:  1. Patient was evaluated. 2. Excisional debridement of the plantar wart lesion(s) was performed using a chisel blade. Cantharone was applied and the lesion(s) was dressed with a dry sterile dressing. 3. patient is to return to clinic in 2 weeks  Edrick Kins, DPM [facility]  Dr. Edrick Kins, [address] [place]                                        [place], [place] [address]                Office [phone]  Fax [phone]

## 2020-02-28 ENCOUNTER — Ambulatory Visit: Payer: Medicaid Other | Admitting: Podiatry

## 2020-03-06 ENCOUNTER — Ambulatory Visit: Payer: Medicaid Other | Admitting: Podiatry

## 2020-08-06 ENCOUNTER — Encounter: Payer: Self-pay | Admitting: Emergency Medicine

## 2020-08-06 ENCOUNTER — Other Ambulatory Visit: Payer: Self-pay

## 2020-08-06 ENCOUNTER — Emergency Department: Payer: Medicaid Other

## 2020-08-06 ENCOUNTER — Emergency Department
Admission: EM | Admit: 2020-08-06 | Discharge: 2020-08-07 | Disposition: A | Discharge status: Home / Self Care | Payer: Medicaid Other | Attending: Emergency Medicine | Admitting: Emergency Medicine

## 2020-08-06 DIAGNOSIS — Z5321 Procedure and treatment not carried out due to patient leaving prior to being seen by health care provider: Secondary | ICD-10-CM | POA: Insufficient documentation

## 2020-08-06 DIAGNOSIS — R0789 Other chest pain: Secondary | ICD-10-CM | POA: Insufficient documentation

## 2020-08-06 LAB — CBC
HCT: 46.1 % (ref 39.0–52.0)
Hemoglobin: 14.9 g/dL (ref 13.0–17.0)
MCH: 21.9 pg — ABNORMAL LOW (ref 26.0–34.0)
MCHC: 32.3 g/dL (ref 30.0–36.0)
MCV: 67.9 fL — ABNORMAL LOW (ref 80.0–100.0)
Platelets: 295 10*3/uL (ref 150–400)
RBC: 6.79 MIL/uL — ABNORMAL HIGH (ref 4.22–5.81)
RDW: 17.5 % — ABNORMAL HIGH (ref 11.5–15.5)
WBC: 11.5 10*3/uL — ABNORMAL HIGH (ref 4.0–10.5)
nRBC: 0 % (ref 0.0–0.2)

## 2020-08-06 LAB — BASIC METABOLIC PANEL
Anion gap: 8 (ref 5–15)
BUN: 13 mg/dL (ref 6–20)
CO2: 24 mmol/L (ref 22–32)
Calcium: 8.9 mg/dL (ref 8.9–10.3)
Chloride: 106 mmol/L (ref 98–111)
Creatinine, Ser: 0.98 mg/dL (ref 0.61–1.24)
GFR, Estimated: >60 mL/min (ref >60)
Glucose, Bld: 86 mg/dL (ref 70–99)
Potassium: 3.9 mmol/L (ref 3.5–5.1)
Sodium: 138 mmol/L (ref 135–145)

## 2020-08-06 LAB — TROPONIN I (HIGH SENSITIVITY): Troponin I (High Sensitivity): 5 ng/L (ref <18)

## 2020-08-06 NOTE — ED Triage Notes (Signed)
Pt reports had some sharp pain in his left chest last pm when he laid down. Pt denies SOB, Nausea and other sx's.

## 2020-08-07 NOTE — ED Notes (Signed)
Pt not in facility at this time when RN to remove patient from in facility.

## 2020-10-25 ENCOUNTER — Other Ambulatory Visit: Payer: Self-pay

## 2020-10-25 ENCOUNTER — Encounter (HOSPITAL_COMMUNITY): Payer: Self-pay

## 2020-10-25 ENCOUNTER — Ambulatory Visit (HOSPITAL_COMMUNITY)
Admission: EM | Admit: 2020-10-25 | Discharge: 2020-10-25 | Disposition: A | Discharge status: Home / Self Care | Payer: Medicaid Other | Attending: Family Medicine | Admitting: Family Medicine

## 2020-10-25 DIAGNOSIS — J4541 Moderate persistent asthma with (acute) exacerbation: Secondary | ICD-10-CM | POA: Diagnosis not present

## 2020-10-25 DIAGNOSIS — K0889 Other specified disorders of teeth and supporting structures: Secondary | ICD-10-CM

## 2020-10-25 MED ORDER — IBUPROFEN 800 MG PO TABS: 800.0000 mg | ORAL_TABLET | Freq: Three times a day (TID) | ORAL | 0 refills | Status: AC

## 2020-10-25 MED ORDER — ALBUTEROL SULFATE HFA 108 (90 BASE) MCG/ACT IN AERS: 1.0000 | INHALATION_SPRAY | Freq: Four times a day (QID) | RESPIRATORY_TRACT | 1 refills | Status: AC | PRN

## 2020-10-25 MED ORDER — AMOXICILLIN 875 MG PO TABS: 875.0000 mg | ORAL_TABLET | Freq: Two times a day (BID) | ORAL | 0 refills | Status: AC

## 2020-10-25 NOTE — ED Provider Notes (Signed)
[facility]   [id number] 10/25/20 Arrival Time: 8527  ASSESSMENT & PLAN:  1. Pain, dental   2. Moderate persistent asthma with acute exacerbation    No sign of abscess requiring I&D at this time. Discussed. No resp distress.  Meds ordered this encounter  Medications   albuterol (VENTOLIN HFA) 108 (90 Base) MCG/ACT inhaler    Sig: Inhale 1-2 puffs into the lungs every 6 (six) hours as needed for wheezing or shortness of breath.    Dispense:  1 each    Refill:  1   ibuprofen (ADVIL) 800 MG tablet    Sig: Take 1 tablet (800 mg total) by mouth 3 (three) times daily with meals.    Dispense:  21 tablet    Refill:  0   amoxicillin (AMOXIL) 875 MG tablet    Sig: Take 1 tablet (875 mg total) by mouth 2 (two) times daily for 10 days.    Dispense:  20 tablet    Refill:  0   He will schedule dental evaluation as soon as possible if not improving over the next 24-48 hours.  Reviewed expectations re: course of current medical issues. Questions answered. Outlined signs and symptoms indicating need for more acute intervention. Patient verbalized understanding. After Visit Summary given.   SUBJECTIVE:  Andre Padilla is a 35 y.o. male who reports gradual onset of left upper dental pain described as aching/throbbing. Present for one week. Fever: absent. Tolerating PO intake but reports pain with chewing. Normal swallowing. He does not see a dentist regularly. No neck swelling or pain. OTC analgesics without relief.  Also recent asthma exacerbation. Past week. Wheezing; fairly persistent. Out of inhaler. No SOB here.  OBJECTIVE: Vitals:   10/25/20 1537  BP: 120/63  Pulse: 64  Resp: 17  Temp: 98.9 F (37.2 C)  TempSrc: Oral  SpO2: 95%    General appearance: alert; no distress HENT: normocephalic; atraumatic; dentition: fair; left upper gum without areas of fluctuance, drainage, or bleeding and with tenderness to palpation; normal jaw movement without difficulty Neck:  supple without LAD; FROM; trachea midline Lungs: normal respirations; unlabored; speaks full sentences without difficulty; bilat wheezing Skin: warm and dry Psychological: alert and cooperative; normal mood and affect  No Known Allergies  Past Medical History:  Diagnosis Date   Abdominal pain    SHARP PAIN    Lead poisoning    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   Social History   Socioeconomic History   Marital status: Single    Spouse name: Not on file   Number of children: Not on file   Years of education: Not on file   Highest education level: Not on file  Occupational History   Not on file  Tobacco Use   Smoking status: Every Day    Packs/day: 0.15    Types: Cigarettes   Smokeless tobacco: Never  Vaping Use   Vaping Use: Never used  Substance and Sexual Activity   Alcohol use: No   Drug use: No   Sexual activity: Not on file  Other Topics Concern   Not on file  Social History Narrative   Not on file   Social Determinants of Health   Financial Resource Strain: Not on file  Food Insecurity: Not on file  Transportation Needs: Not on file  Physical Activity: Not on file  Stress: Not on file  Social Connections: Not on file  Intimate Partner Violence: Not on file   Family History  Problem Relation Age of Onset   Hypertension Mother    Asthma Mother    Hypertension Maternal Aunt    Hypertension Maternal Uncle    Breast cancer Maternal Grandmother    Past Surgical History:  Procedure Laterality Date   APPENDECTOMY N/A 02/19/2017   Procedure: APPENDECTOMY;  Surgeon: Ileana Roup, MD;  Location: [facility];  Service: General;  Laterality: N/A;   FOOT SURGERY Bilateral    CALLUS REMOVAL    LAPAROTOMY N/A 02/19/2017   Procedure: EXPLORATORY LAPAROSCOPY LADD'S PROCEDURE WITH APPENDECTOMY;  Surgeon: Ileana Roup, MD;  Location: [facility];  Service: General;  Laterality: Ginette Pitman, MD 10/25/20 1629

## 2020-10-25 NOTE — ED Triage Notes (Signed)
Pt presents with left side dental pain for over a week; pt also complains of loss of voice, nasal drainage, congestion, and shortness of breath X 1 week.

## 2021-05-28 ENCOUNTER — Encounter (HOSPITAL_COMMUNITY): Payer: Self-pay | Admitting: Emergency Medicine

## 2021-05-28 ENCOUNTER — Other Ambulatory Visit: Payer: Self-pay

## 2021-05-28 ENCOUNTER — Emergency Department (HOSPITAL_COMMUNITY): Payer: Medicaid Other

## 2021-05-28 ENCOUNTER — Emergency Department (HOSPITAL_COMMUNITY)
Admission: EM | Admit: 2021-05-28 | Discharge: 2021-05-28 | Disposition: A | Discharge status: Home / Self Care | Payer: Medicaid Other | Attending: Emergency Medicine | Admitting: Emergency Medicine

## 2021-05-28 DIAGNOSIS — R0789 Other chest pain: Secondary | ICD-10-CM | POA: Insufficient documentation

## 2021-05-28 DIAGNOSIS — R059 Cough, unspecified: Secondary | ICD-10-CM | POA: Diagnosis not present

## 2021-05-28 DIAGNOSIS — I10 Essential (primary) hypertension: Secondary | ICD-10-CM | POA: Diagnosis not present

## 2021-05-28 HISTORY — DX: Essential (primary) hypertension: I10

## 2021-05-28 LAB — CBC WITH DIFFERENTIAL/PLATELET
Abs Immature Granulocytes: 0.02 10*3/uL (ref 0.00–0.07)
Basophils Absolute: 0.1 10*3/uL (ref 0.0–0.1)
Basophils Relative: 1 %
Eosinophils Absolute: 0.1 10*3/uL (ref 0.0–0.5)
Eosinophils Relative: 1 %
HCT: 45 % (ref 39.0–52.0)
Hemoglobin: 14.3 g/dL (ref 13.0–17.0)
Immature Granulocytes: 0 %
Lymphocytes Relative: 37 %
Lymphs Abs: 3.8 10*3/uL (ref 0.7–4.0)
MCH: 21.9 pg — ABNORMAL LOW (ref 26.0–34.0)
MCHC: 31.8 g/dL (ref 30.0–36.0)
MCV: 68.9 fL — ABNORMAL LOW (ref 80.0–100.0)
Monocytes Absolute: 0.6 10*3/uL (ref 0.1–1.0)
Monocytes Relative: 6 %
Neutro Abs: 5.6 10*3/uL (ref 1.7–7.7)
Neutrophils Relative %: 55 %
Platelets: 260 10*3/uL (ref 150–400)
RBC: 6.53 MIL/uL — ABNORMAL HIGH (ref 4.22–5.81)
RDW: 17.8 % — ABNORMAL HIGH (ref 11.5–15.5)
WBC: 10.2 10*3/uL (ref 4.0–10.5)
nRBC: 0 % (ref 0.0–0.2)

## 2021-05-28 LAB — COMPREHENSIVE METABOLIC PANEL
ALT: 35 U/L (ref 0–44)
AST: 37 U/L (ref 15–41)
Albumin: 4.2 g/dL (ref 3.5–5.0)
Alkaline Phosphatase: 56 U/L (ref 38–126)
Anion gap: 8 (ref 5–15)
BUN: 11 mg/dL (ref 6–20)
CO2: 26 mmol/L (ref 22–32)
Calcium: 9.1 mg/dL (ref 8.9–10.3)
Chloride: 105 mmol/L (ref 98–111)
Creatinine, Ser: 0.99 mg/dL (ref 0.61–1.24)
GFR, Estimated: >60 mL/min (ref >60)
Glucose, Bld: 77 mg/dL (ref 70–99)
Potassium: 4.3 mmol/L (ref 3.5–5.1)
Sodium: 139 mmol/L (ref 135–145)
Total Bilirubin: 1.1 mg/dL (ref 0.3–1.2)
Total Protein: 7 g/dL (ref 6.5–8.1)

## 2021-05-28 LAB — TROPONIN I (HIGH SENSITIVITY)
Troponin I (High Sensitivity): 5 ng/L (ref <18)
Troponin I (High Sensitivity): 5 ng/L (ref <18)

## 2021-05-28 MED ORDER — IOHEXOL 350 MG/ML SOLN
100.0000 mL | Freq: Once | INTRAVENOUS | Status: AC | PRN
  Administered 2021-05-28: 57 mL via INTRAVENOUS

## 2021-05-28 MED ORDER — KETOROLAC TROMETHAMINE 15 MG/ML IJ SOLN
15.0000 mg | Freq: Once | INTRAMUSCULAR | Status: AC
  Administered 2021-05-28: 15 mg via INTRAVENOUS
  Filled 2021-05-28: qty 1

## 2021-05-28 MED ORDER — LIDOCAINE VISCOUS HCL 2 % MT SOLN
15.0000 mL | Freq: Once | OROMUCOSAL | Status: AC
  Administered 2021-05-28: 15 mL via ORAL
  Filled 2021-05-28: qty 15

## 2021-05-28 MED ORDER — ALUM & MAG HYDROXIDE-SIMETH 200-200-20 MG/5ML PO SUSP
30.0000 mL | Freq: Once | ORAL | Status: AC
  Administered 2021-05-28: 30 mL via ORAL
  Filled 2021-05-28: qty 30

## 2021-05-28 MED ORDER — LACTATED RINGERS IV BOLUS
1000.0000 mL | Freq: Once | INTRAVENOUS | Status: AC
  Administered 2021-05-28: 1000 mL via INTRAVENOUS

## 2021-05-28 NOTE — ED Provider Notes (Signed)
?[facility] ?Provider Note ? ? ?CSN: [id number] ?Arrival date & time: 05/28/21  1125 ? ?  ? ?History ? ?Chief Complaint  ?Patient presents with  ? Chest Pain  ? ? ?Andre Padilla is a 36 y.o. male. ? ? ?Chest Pain ?Associated symptoms: cough   ?Patient presents for chest pain.  He reports that location is throughout the anterior aspect of his chest wall.  He has been present for the past week and a half.  Severity has waxed and waned.  Currently, it is 10/10.  He has not taken any medications for analgesia since onset.  Pain is not worsened or improved with positioning.  He has had a dry cough.  Onset of cough was after the onset of chest pain.  He denies any recent straining or injuries.  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  He describes the work is moderately physical.  He feels that he sweats easily with physical exertion.  He denies any subjective fevers at home but states that he does currently feel warm.  He has not had any other associated symptoms.  Patient has a diagnosis of hypertension.  He has no other known risk factors for ACS.  He denies alcohol or illicit drug use. ?HPI: A 36 year old patient with a history of hypertension and obesity presents for evaluation of chest pain. Initial onset of pain was less than one hour ago. The patient's chest pain is sharp and is not worse with exertion. The patient's chest pain is middle- or left-sided, is not well-localized, is not described as heaviness/pressure/tightness and does not radiate to the arms/jaw/neck. The patient does not complain of nausea and denies diaphoresis. The patient has no history of stroke, has no history of peripheral artery disease, has not smoked in the past 90 days, denies any history of treated diabetes, has no relevant family history of coronary artery disease (first degree relative at less than age 71) and has no history of hypercholesterolemia.  ? ?Home Medications ?Prior to Admission  medications   ?Medication Sig Start Date End Date Taking? Authorizing Provider  ?albuterol (VENTOLIN HFA) 108 (90 Base) MCG/ACT inhaler Inhale 1-2 puffs into the lungs every 6 (six) hours as needed for wheezing or shortness of breath. 10/25/20   Vanessa Kick, MD  ?dicyclomine (BENTYL) 20 MG tablet Take 1 tableet by mouth 30 min before meals. ?Patient not taking: Reported on 02/07/2017 10/18/16   Esterwood, Amy S, PA-C  ?famotidine (PEPCID) 20 MG tablet Take 1 tablet (20 mg total) by mouth 2 (two) times daily. ?Patient not taking: Reported on 11/18/2016 10/07/16   Ocie Cornfield T, PA-C  ?ibuprofen (ADVIL) 800 MG tablet Take 1 tablet (800 mg total) by mouth 3 (three) times daily with meals. 10/25/20   Vanessa Kick, MD  ?omeprazole (PRILOSEC) 20 MG capsule Take 1 tablet by mouth every morning. ?Patient not taking: Reported on 02/07/2017 10/18/16   Esterwood, Amy S, PA-C  ?ondansetron (ZOFRAN ODT) 4 MG disintegrating tablet zofran 4 mg every 6 hours as needed for n/v 04/01/17   Esterwood, Amy S, PA-C  ?ondansetron (ZOFRAN) 4 MG tablet Take 1 tablet (4 mg total) 2 (two) times daily by mouth. ?Patient not taking: Reported on 02/07/2017 11/20/16   Milus Banister, MD  ?predniSONE (DELTASONE) 50 MG tablet Take 50 mg by mouth daily. 01/29/20   [provider]  ?promethazine (PHENERGAN) 50 MG suppository Place 1 suppository (50 mg total) 2 (two) times daily rectally. ?Patient not taking: Reported  on 02/07/2017 11/19/16   Milus Banister, MD  ?sucralfate (CARAFATE) 1 g tablet Take 1 tablet (1 g total) by mouth 4 (four) times daily -  with meals and at bedtime. ?Patient not taking: Reported on 02/07/2017 10/07/16   Doristine Devoid, PA-C  ?   ? ?Allergies    ?Patient has no known allergies.   ? ?Review of Systems   ?Review of Systems  ?Respiratory:  Positive for cough.   ?Cardiovascular:  Positive for chest pain.  ?All other systems reviewed and are negative. ? ?Physical Exam ?Updated Vital Signs ?BP 118/85   Pulse 91    Temp 98.7 ?F (37.1 ?C) (Oral)   Resp 14   Ht '5\' 11"'$  (1.803 m)   Wt 102.1 kg   SpO2 96%   BMI 31.38 kg/m?  ?Physical Exam ?Vitals and nursing note reviewed.  ?Constitutional:   ?   General: He is not in acute distress. ?   Appearance: He is well-developed and normal weight. He is not ill-appearing, toxic-appearing or diaphoretic.  ?HENT:  ?   Head: Normocephalic and atraumatic.  ?Eyes:  ?   Conjunctiva/sclera: Conjunctivae normal.  ?Neck:  ?   Vascular: No JVD.  ?Cardiovascular:  ?   Rate and Rhythm: Normal rate and regular rhythm.  ?   Heart sounds: No murmur heard. ?Pulmonary:  ?   Effort: Pulmonary effort is normal. No tachypnea or respiratory distress.  ?   Breath sounds: Normal breath sounds. No decreased breath sounds, wheezing, rhonchi or rales.  ?Chest:  ?   Chest wall: No tenderness.  ?Abdominal:  ?   Palpations: Abdomen is soft.  ?   Tenderness: There is no abdominal tenderness.  ?Musculoskeletal:     ?   General: No swelling.  ?   Cervical back: Normal range of motion and neck supple.  ?   Right lower leg: No edema.  ?   Left lower leg: No edema.  ?Skin: ?   General: Skin is warm and dry.  ?   Capillary Refill: Capillary refill takes less than 2 seconds.  ?   Coloration: Skin is not cyanotic or pale.  ?Neurological:  ?   General: No focal deficit present.  ?   Mental Status: He is alert and oriented to person, place, and time.  ?   Cranial Nerves: No cranial nerve deficit.  ?   Motor: No weakness.  ?Psychiatric:     ?   Mood and Affect: Mood normal.     ?   Behavior: Behavior normal.  ? ? ?ED Results / Procedures / Treatments   ?Labs ?(all labs ordered are listed, but only abnormal results are displayed) ?Labs Reviewed  ?CBC WITH DIFFERENTIAL/PLATELET - Abnormal; Notable for the following components:  ?    Result Value  ? RBC 6.53 (*)   ? MCV 68.9 (*)   ? MCH 21.9 (*)   ? RDW 17.8 (*)   ? All other components within normal limits  ?COMPREHENSIVE METABOLIC PANEL  ?TROPONIN I (HIGH SENSITIVITY)   ?TROPONIN I (HIGH SENSITIVITY)  ? ? ?EKG ?EKG Interpretation ? ?Date/Time:  Monday May 28 2021 11:56:30 EDT ?Ventricular Rate:  77 ?PR Interval:  136 ?QRS Duration: 94 ?QT Interval:  374 ?QTC Calculation: 423 ?R Axis:   84 ?Text Interpretation: Normal sinus rhythm Normal ECG Confirmed by Godfrey Pick [phone]) on 05/28/2021 3:03:49 PM ? ?Radiology ?DG Chest 2 View ? ?Result Date: 05/28/2021 ?CLINICAL DATA:  Chest pain EXAM: CHEST -  2 VIEW COMPARISON:  08/06/2020 FINDINGS: Cardiac and mediastinal contours are within normal limits. No focal pulmonary opacity. No pleural effusion or pneumothorax. No acute osseous abnormality. IMPRESSION: No acute cardiopulmonary process. Electronically Signed   By: Merilyn Baba M.D.   On: 05/28/2021 12:49  ? ?CT Angio Chest PE W and/or Wo Contrast ? ?Result Date: 05/28/2021 ?CLINICAL DATA:  Chest pain for a few days, worsened. Concern for pulmonary embolism EXAM: CT ANGIOGRAPHY CHEST WITH CONTRAST TECHNIQUE: Multidetector CT imaging of the chest was performed using the standard protocol during bolus administration of intravenous contrast. Multiplanar CT image reconstructions and MIPs were obtained to evaluate the vascular anatomy. RADIATION DOSE REDUCTION: This exam was performed according to the departmental dose-optimization program which includes automated exposure control, adjustment of the mA and/or kV according to patient size and/or use of iterative reconstruction technique. CONTRAST:  64m OMNIPAQUE IOHEXOL 350 MG/ML SOLN COMPARISON:  Chest radiograph 05/28/2021 FINDINGS: Cardiovascular: There is adequate opacification of the pulmonary arteries to the segmental level. There is no evidence of pulmonary embolism. The heart is not enlarged. There is no pericardial effusion. The thoracic aorta is normal. Mediastinum/Nodes: The imaged thyroid is unremarkable. The esophagus is grossly unremarkable. There is no mediastinal, hilar, or axillary lymphadenopathy. Lungs/Pleura: The trachea and  central airways are patent. The lungs well inflated. There is no focal consolidation or pulmonary edema. There is no pleural effusion or pneumothorax. There are no suspicious nodules. There is mild scarring in the right a

## 2021-05-28 NOTE — Discharge Instructions (Signed)
Take Tylenol as needed for continued pain.  Follow-up with cardiology office.  Return to the ED for any worsening of symptoms. ?

## 2021-05-28 NOTE — ED Triage Notes (Signed)
Pt states he started having cp a few days ago. Pain has worsened and moved into his left shoulder. Denies, n/v. Pt states he does feel clammy. ?

## 2021-05-28 NOTE — ED Notes (Signed)
Pt off unit to CT

## 2021-05-28 NOTE — ED Provider Triage Note (Signed)
Emergency Medicine Provider Triage Evaluation Note ? ?Lalla Brothers , a 36 y.o. male  was evaluated in triage.  Pt complains of chest pain.  Reports sharp chest pain that will move from the left side of his chest to the center of his chest to the right side of his chest.  Intermittent for the past few days.  Reports associated dry cough.  Denies any shortness of breath or leg swelling, history of DVT, PE, MI, recent immobilization. ? ?Review of Systems  ?Positive: Chest pain cough ?Negative: Shortness of breath ? ?Physical Exam  ?BP (!) 140/97 (BP Location: Left Arm)   Pulse 74   Temp 98.7 ?F (37.1 ?C) (Oral)   Resp 16   Ht '5\' 11"'$  (1.803 m)   Wt 102.1 kg   SpO2 100%   BMI 31.38 kg/m?  ?Gen:   Awake, no distress   ?Resp:  Normal effort  ?MSK:   Moves extremities without difficulty  ?Other:  Lungs are clear to auscultation bilaterally no lower extremity edema bilaterally ? ?Medical Decision Making  ?Medically screening exam initiated at 12:17 PM.  Appropriate orders placed.  Jerrod Damiano Kissner was informed that the remainder of the evaluation will be completed by another provider, this initial triage assessment does not replace that evaluation, and the importance of remaining in the ED until their evaluation is complete. ? ?Work-up initiated ?  ?Delia Heady, PA-C ?05/28/21 1220 ? ?

## 2021-06-12 ENCOUNTER — Encounter: Payer: Self-pay | Admitting: *Deleted

## 2021-06-26 NOTE — Progress Notes (Signed)
CARDIOLOGY CONSULT NOTE       Patient ID: Andre Padilla MRN: [id number] DOB/AGE: 04-13-1985 36 y.o.  Admit date: (Not on file) Referring Physician: [facility] ED Primary Physician: Pa, [facility] Primary Cardiologist: New Reason for Consultation: Chest pain  Active Problems:   * No active hospital problems. *   HPI:  36 y.o. referred by [facility] ED Dr. Durwin Nora for chest pain History of HTN and obesity Seen in ED 05/28/21 with atypical non exertional sharp pain for a week and a half. Rated pain as 10/10 Dry cough started after pain. 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    Family History  Problem Relation Age of Onset   Hypertension Mother    Asthma Mother    Hypertension Maternal Aunt    Hypertension Maternal Uncle    Breast cancer Maternal Grandmother     Social History   Socioeconomic History   Marital status: Single    Spouse name: Not on file   Number of children: Not on file   Years of education: Not on file   Highest education level: Not on file  Occupational History   Not on file  Tobacco Use   Smoking status: Every Day    Packs/day: 0.15     Types: Cigarettes   Smokeless tobacco: Never  Vaping Use   Vaping Use: Never used  Substance and Sexual Activity   Alcohol use: Yes    Comment: occassionally   Drug use: No   Sexual activity: Not on file  Other Topics Concern   Not on file  Social History Narrative   Not on file   Social Determinants of Health   Financial Resource Strain: Not on file  Food Insecurity: Not on file  Transportation Needs: Not on file  Physical Activity: Not on file  Stress: Not on file  Social Connections: Not on file  Intimate Partner Violence: Not on file    Past Surgical History:  Procedure Laterality Date   APPENDECTOMY N/A 02/19/2017   Procedure: APPENDECTOMY;  Surgeon: Andria Meuse, MD;  Location: [facility] ORS;  Service: General;  Laterality: N/A;   FOOT SURGERY Bilateral    CALLUS REMOVAL    LAPAROTOMY N/A 02/19/2017   Procedure: EXPLORATORY LAPAROSCOPY LADD'S PROCEDURE WITH APPENDECTOMY;  Surgeon: Andria Meuse, MD;  Location: [facility];  Service: General;  Laterality: N/A;      Current Outpatient Medications:    albuterol (VENTOLIN HFA) 108 (90 Base) MCG/ACT inhaler, Inhale 1-2 puffs into the lungs every 6 (  six) hours as needed for wheezing or shortness of breath. (Patient not taking: Reported on 07/09/2021), Disp: 1 each, Rfl: 1   dicyclomine (BENTYL) 20 MG tablet, Take 1 tableet by mouth 30 min before meals. (Patient not taking: Reported on 02/07/2017), Disp: 90 tablet, Rfl: 3   famotidine (PEPCID) 20 MG tablet, Take 1 tablet (20 mg total) by mouth 2 (two) times daily. (Patient not taking: Reported on 11/18/2016), Disp: 30 tablet, Rfl: 0   ibuprofen (ADVIL) 800 MG tablet, Take 1 tablet (800 mg total) by mouth 3 (three) times daily with meals. (Patient not taking: Reported on 07/09/2021), Disp: 21 tablet, Rfl: 0   omeprazole (PRILOSEC) 20 MG capsule, Take 1 tablet by mouth every morning. (Patient not taking: Reported on 02/07/2017), Disp: 30 capsule, Rfl: 3   ondansetron (ZOFRAN ODT) 4 MG  disintegrating tablet, zofran 4 mg every 6 hours as needed for n/v (Patient not taking: Reported on 07/09/2021), Disp: 30 tablet, Rfl: 2   ondansetron (ZOFRAN) 4 MG tablet, Take 1 tablet (4 mg total) 2 (two) times daily by mouth. (Patient not taking: Reported on 02/07/2017), Disp: 30 tablet, Rfl: 3   predniSONE (DELTASONE) 50 MG tablet, Take 50 mg by mouth daily. (Patient not taking: Reported on 07/09/2021), Disp: , Rfl:    promethazine (PHENERGAN) 50 MG suppository, Place 1 suppository (50 mg total) 2 (two) times daily rectally. (Patient not taking: Reported on 02/07/2017), Disp: 50 each, Rfl: 0   sucralfate (CARAFATE) 1 g tablet, Take 1 tablet (1 g total) by mouth 4 (four) times daily -  with meals and at bedtime. (Patient not taking: Reported on 02/07/2017), Disp: 30 tablet, Rfl: 0    Physical Exam: Blood pressure (!) 146/70, pulse 90, height 5\' 11"  (1.803 m), weight 214 lb (97.1 kg), SpO2 97 %.   Affect appropriate Healthy:  appears stated age HEENT: normal Neck supple with no adenopathy JVP normal no bruits no thyromegaly Lungs clear with no wheezing and good diaphragmatic motion Heart:  S1/S2 no murmur, no rub, gallop or click PMI normal Abdomen: benighn, BS positve, no tenderness, no AAA no bruit.  No HSM or HJR Distal pulses intact with no bruits No edema Neuro non-focal Skin warm and dry No muscular weakness   Labs:   Lab Results  Component Value Date   WBC 10.2 05/28/2021   HGB 14.3 05/28/2021   HCT 45.0 05/28/2021   MCV 68.9 (L) 05/28/2021   PLT 260 05/28/2021   No results for input(s): "NA", "K", "CL", "CO2", "BUN", "CREATININE", "CALCIUM", "PROT", "BILITOT", "ALKPHOS", "ALT", "AST", "GLUCOSE" in the last 168 hours.  Invalid input(s): "LABALBU" No results found for: "CKTOTAL", "CKMB", "CKMBINDEX", "TROPONINI" No results found for: "CHOL" No results found for: "HDL" No results found for: "LDLCALC" No results found for: "TRIG" No results found for: "CHOLHDL" No  results found for: "LDLDIRECT"    Radiology: No results found.  EKG: See HPI   ASSESSMENT AND PLAN:   Chest Pain: atypical normal ECG r/o desptie 10/10 pain. CTA no PE normal aorta and no coronary calcium with normal appearing origins to arteries. Likely Costochondritis NSAI's  TTE to r/o structural heart dx and ETT   HTN:  BP normal in ER despite pain f/u primary   Needs f/u with primary to r/o sickle trait and follow BP    TTE ETT NSAI's   F/U PRN   Signed: Charlton Haws 07/09/2021, 2:38 PM

## 2021-07-09 ENCOUNTER — Ambulatory Visit (INDEPENDENT_AMBULATORY_CARE_PROVIDER_SITE_OTHER): Payer: Medicaid Other | Admitting: Cardiovascular Disease

## 2021-07-09 ENCOUNTER — Encounter: Payer: Self-pay | Admitting: Cardiovascular Disease

## 2021-07-09 VITALS — BP 146/70 | HR 90 | Ht 71.0 in | Wt 214.0 lb

## 2021-07-09 DIAGNOSIS — R079 Chest pain, unspecified: Secondary | ICD-10-CM | POA: Diagnosis not present

## 2021-07-09 DIAGNOSIS — I10 Essential (primary) hypertension: Secondary | ICD-10-CM | POA: Diagnosis not present

## 2021-07-09 DIAGNOSIS — F1290 Cannabis use, unspecified, uncomplicated: Secondary | ICD-10-CM

## 2021-07-26 ENCOUNTER — Ambulatory Visit (INDEPENDENT_AMBULATORY_CARE_PROVIDER_SITE_OTHER): Payer: Medicaid Other

## 2021-07-26 ENCOUNTER — Ambulatory Visit (HOSPITAL_COMMUNITY): Payer: Medicaid Other | Attending: Internal Medicine

## 2021-07-26 DIAGNOSIS — R079 Chest pain, unspecified: Secondary | ICD-10-CM | POA: Diagnosis present

## 2021-07-26 LAB — ECHOCARDIOGRAM COMPLETE
Area-P 1/2: 2.91 cm2
S' Lateral: 3.7 cm

## 2021-07-27 ENCOUNTER — Telehealth: Payer: Self-pay

## 2021-07-27 LAB — EXERCISE TOLERANCE TEST
Angina Index: 0
Duke Treadmill Score: 0
Estimated workload: 7
Exercise duration (min): 5 min
Exercise duration (sec): 0 s
MPHR: 187 {beats}/min
Peak HR: 107 {beats}/min
Percent HR: 57 %
Rest HR: 64 {beats}/min
ST Depression (mm): 1 mm

## 2021-07-27 NOTE — Telephone Encounter (Signed)
-----   Message from Josue Hector, MD sent at 07/27/2021  9:41 AM EDT ----- ETT abnormal TTE was normal needs cardiac CTA 100 mg lopressor 2 hours prior to study

## 2021-07-27 NOTE — Telephone Encounter (Signed)
Left message for patient to call back  

## 2021-08-07 ENCOUNTER — Other Ambulatory Visit: Payer: Self-pay | Admitting: Internal Medicine

## 2021-08-08 LAB — COMPLETE METABOLIC PANEL WITH GFR
AG Ratio: 1.8 (calc) (ref 1.0–2.5)
ALT: 22 U/L (ref 9–46)
AST: 26 U/L (ref 10–40)
Albumin: 4.8 g/dL (ref 3.6–5.1)
Alkaline phosphatase (APISO): 64 U/L (ref 36–130)
BUN: 12 mg/dL (ref 7–25)
CO2: 23 mmol/L (ref 20–32)
Calcium: 9.7 mg/dL (ref 8.6–10.3)
Chloride: 103 mmol/L (ref 98–110)
Creat: 1.01 mg/dL (ref 0.60–1.26)
Globulin: 2.7 g/dL (calc) (ref 1.9–3.7)
Glucose, Bld: 72 mg/dL (ref 65–99)
Potassium: 4.1 mmol/L (ref 3.5–5.3)
Sodium: 140 mmol/L (ref 135–146)
Total Bilirubin: 0.4 mg/dL (ref 0.2–1.2)
Total Protein: 7.5 g/dL (ref 6.1–8.1)
eGFR: 101 mL/min/{1.73_m2} (ref >=60)

## 2021-08-08 LAB — CBC
HCT: 48.5 % (ref 38.5–50.0)
Hemoglobin: 14.4 g/dL (ref 13.2–17.1)
MCH: 21.3 pg — ABNORMAL LOW (ref 27.0–33.0)
MCHC: 29.7 g/dL — ABNORMAL LOW (ref 32.0–36.0)
MCV: 71.9 fL — ABNORMAL LOW (ref 80.0–100.0)
MPV: 10.1 fL (ref 7.5–12.5)
Platelets: 289 10*3/uL (ref 140–400)
RBC: 6.75 10*6/uL — ABNORMAL HIGH (ref 4.20–5.80)
RDW: 17.1 % — ABNORMAL HIGH (ref 11.0–15.0)
WBC: 9.9 10*3/uL (ref 3.8–10.8)

## 2021-08-08 LAB — LIPID PANEL
Cholesterol: 237 mg/dL — ABNORMAL HIGH (ref <200)
HDL: 42 mg/dL (ref >=40)
Non-HDL Cholesterol (Calc): 195 mg/dL (calc) — ABNORMAL HIGH (ref <130)
Total CHOL/HDL Ratio: 5.6 (calc) — ABNORMAL HIGH (ref <5.0)
Triglycerides: 525 mg/dL — ABNORMAL HIGH (ref <150)

## 2021-08-08 LAB — TSH: TSH: 0.04 mIU/L — ABNORMAL LOW (ref 0.40–4.50)

## 2021-08-08 LAB — VITAMIN D 25 HYDROXY (VIT D DEFICIENCY, FRACTURES): Vit D, 25-Hydroxy: 21 ng/mL — ABNORMAL LOW (ref 30–100)

## 2021-08-22 ENCOUNTER — Other Ambulatory Visit: Payer: Self-pay | Admitting: Internal Medicine

## 2021-08-23 LAB — THYROID PANEL WITH TSH
Free Thyroxine Index: 2.2 (ref 1.4–3.8)
T3 Uptake: 32 % (ref 22–35)
T4, Total: 6.9 ug/dL (ref 4.9–10.5)
TSH: 0.06 mIU/L — ABNORMAL LOW (ref 0.40–4.50)

## 2021-08-23 LAB — T4, FREE: Free T4: 1.2 ng/dL (ref 0.8–1.8)

## 2021-08-23 LAB — EXTRA LAV TOP TUBE

## 2021-08-23 LAB — T3: T3, Total: 136 ng/dL (ref 76–181)

## 2021-09-24 ENCOUNTER — Telehealth: Payer: Self-pay | Admitting: Cardiovascular Disease

## 2021-09-24 NOTE — Telephone Encounter (Signed)
Patient calling in with questions. Please advise

## 2021-09-24 NOTE — Telephone Encounter (Signed)
Patient is most likely calling back about results. Per Dr. Johnsie Cancel, Exercise Tolerance Test is abnormal. Echocardiogram was normal, he needs cardiac CTA, take 100 mg lopressor 2 hours prior to CTA study.  Tried to call patient, no answer, left message to call back.

## 2021-09-27 NOTE — Telephone Encounter (Signed)
Left message for patient to call back  

## 2022-08-27 ENCOUNTER — Other Ambulatory Visit: Payer: Self-pay | Admitting: Internal Medicine

## 2023-10-22 ENCOUNTER — Encounter (HOSPITAL_COMMUNITY): Payer: Self-pay

## 2023-10-22 ENCOUNTER — Emergency Department (HOSPITAL_COMMUNITY)
Admission: EM | Admit: 2023-10-22 | Discharge: 2023-10-22 | Disposition: A | Discharge status: Home / Self Care | Attending: Emergency Medicine | Admitting: Emergency Medicine

## 2023-10-22 ENCOUNTER — Other Ambulatory Visit: Payer: Self-pay

## 2023-10-22 DIAGNOSIS — S39012A Strain of muscle, fascia and tendon of lower back, initial encounter: Secondary | ICD-10-CM | POA: Insufficient documentation

## 2023-10-22 DIAGNOSIS — X501XXA Overexertion from prolonged static or awkward postures, initial encounter: Secondary | ICD-10-CM | POA: Diagnosis not present

## 2023-10-22 DIAGNOSIS — M545 Low back pain, unspecified: Secondary | ICD-10-CM | POA: Diagnosis present

## 2023-10-22 MED ORDER — KETOROLAC TROMETHAMINE 15 MG/ML IJ SOLN
15.0000 mg | Freq: Once | INTRAMUSCULAR | Status: AC
  Administered 2023-10-22: 15 mg via INTRAMUSCULAR
  Filled 2023-10-22: qty 1

## 2023-10-22 MED ORDER — OXYCODONE HCL 5 MG PO TABS
5.0000 mg | ORAL_TABLET | Freq: Once | ORAL | Status: AC
  Administered 2023-10-22: 5 mg via ORAL
  Filled 2023-10-22: qty 1

## 2023-10-22 MED ORDER — ACETAMINOPHEN 500 MG PO TABS
1000.0000 mg | ORAL_TABLET | Freq: Once | ORAL | Status: AC
  Administered 2023-10-22: 1000 mg via ORAL
  Filled 2023-10-22: qty 2

## 2023-10-22 MED ORDER — DIAZEPAM 5 MG PO TABS
5.0000 mg | ORAL_TABLET | Freq: Once | ORAL | Status: AC
  Administered 2023-10-22: 5 mg via ORAL
  Filled 2023-10-22: qty 1

## 2023-10-22 NOTE — ED Triage Notes (Signed)
 Pt reports with lower back pain x 2 weeks.
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
## 2023-10-22 NOTE — Discharge Instructions (Signed)
 Your back pain is most likely due to a muscular strain.  There is been a lot of research on back pain, unfortunately the only thing that seems to really help is Tylenol and ibuprofen.  Relative rest is also important to not lift greater than 10 pounds bending or twisting at the waist.  Please follow-up with your family physician.  The other thing that really seems to benefit patients is physical therapy which your doctor may send you for.  Please return to the emergency department for new numbness or weakness to your arms or legs. Difficulty with urinating or urinating or pooping on yourself.  Also if you cannot feel toilet paper when you wipe or get a fever.   Take 4 over the counter ibuprofen tablets 3 times a day or 2 over-the-counter naproxen tablets twice a day for pain. Also take tylenol 1000mg (2 extra strength) four times a day.   Stretches can also help try: [email or web address]

## 2023-10-27 NOTE — Progress Notes (Unsigned)
    [facility] [address] [place] [address] Phone: [phone]   Assessment and Plan:     ***    Pertinent previous records reviewed include ***   Follow Up: ***     Subjective:   I, Andre Padilla, am serving as a Neurosurgeon for Doctor Morene Mace  Chief Complaint: back pain   HPI:   10/28/2023 Patient is a 38 year old male with back pain. Patient states   Relevant Historical Information: ***  Additional pertinent review of systems negative.   Current Outpatient Medications:    albuterol  (VENTOLIN  HFA) 108 (90 Base) MCG/ACT inhaler, Inhale 1-2 puffs into the lungs every 6 (six) hours as needed for wheezing or shortness of breath. (Patient not taking: Reported on 07/09/2021), Disp: 1 each, Rfl: 1   dicyclomine  (BENTYL ) 20 MG tablet, Take 1 tableet by mouth 30 min before meals. (Patient not taking: Reported on 02/07/2017), Disp: 90 tablet, Rfl: 3   famotidine  (PEPCID ) 20 MG tablet, Take 1 tablet (20 mg total) by mouth 2 (two) times daily. (Patient not taking: Reported on 11/18/2016), Disp: 30 tablet, Rfl: 0   ibuprofen  (ADVIL ) 800 MG tablet, Take 1 tablet (800 mg total) by mouth 3 (three) times daily with meals. (Patient not taking: Reported on 07/09/2021), Disp: 21 tablet, Rfl: 0   omeprazole  (PRILOSEC) 20 MG capsule, Take 1 tablet by mouth every morning. (Patient not taking: Reported on 02/07/2017), Disp: 30 capsule, Rfl: 3   ondansetron  (ZOFRAN  ODT) 4 MG disintegrating tablet, zofran  4 mg every 6 hours as needed for n/v (Patient not taking: Reported on 07/09/2021), Disp: 30 tablet, Rfl: 2   ondansetron  (ZOFRAN ) 4 MG tablet, Take 1 tablet (4 mg total) 2 (two) times daily by mouth. (Patient not taking: Reported on 02/07/2017), Disp: 30 tablet, Rfl: 3   predniSONE (DELTASONE) 50 MG tablet, Take 50 mg by mouth daily. (Patient not taking: Reported on 07/09/2021), Disp: , Rfl:    promethazine  (PHENERGAN ) 50 MG suppository,  Place 1 suppository (50 mg total) 2 (two) times daily rectally. (Patient not taking: Reported on 02/07/2017), Disp: 50 each, Rfl: 0   sucralfate  (CARAFATE ) 1 g tablet, Take 1 tablet (1 g total) by mouth 4 (four) times daily -  with meals and at bedtime. (Patient not taking: Reported on 02/07/2017), Disp: 30 tablet, Rfl: 0   Objective:     There were no vitals filed for this visit.    There is no height or weight on file to calculate BMI.    Physical Exam:    ***   Electronically signed by:  Odis Mace D.CLEMENTEEN AMYE [facility] 7:49 AM 10/27/23

## 2023-10-28 ENCOUNTER — Encounter: Admitting: Sports Medicine

## 2023-10-29 NOTE — Progress Notes (Signed)
 This encounter was created in error - please disregard.

## 2023-11-04 ENCOUNTER — Ambulatory Visit: Admitting: Sports Medicine

## 2023-12-04 IMAGING — DX DG CHEST 2V
3 series · 3 of 3 positions shown · non-contrast
Comparison: 08/06/2020

CLINICAL DATA: Chest pain

EXAM:
CHEST - 2 VIEW

[chest lat]
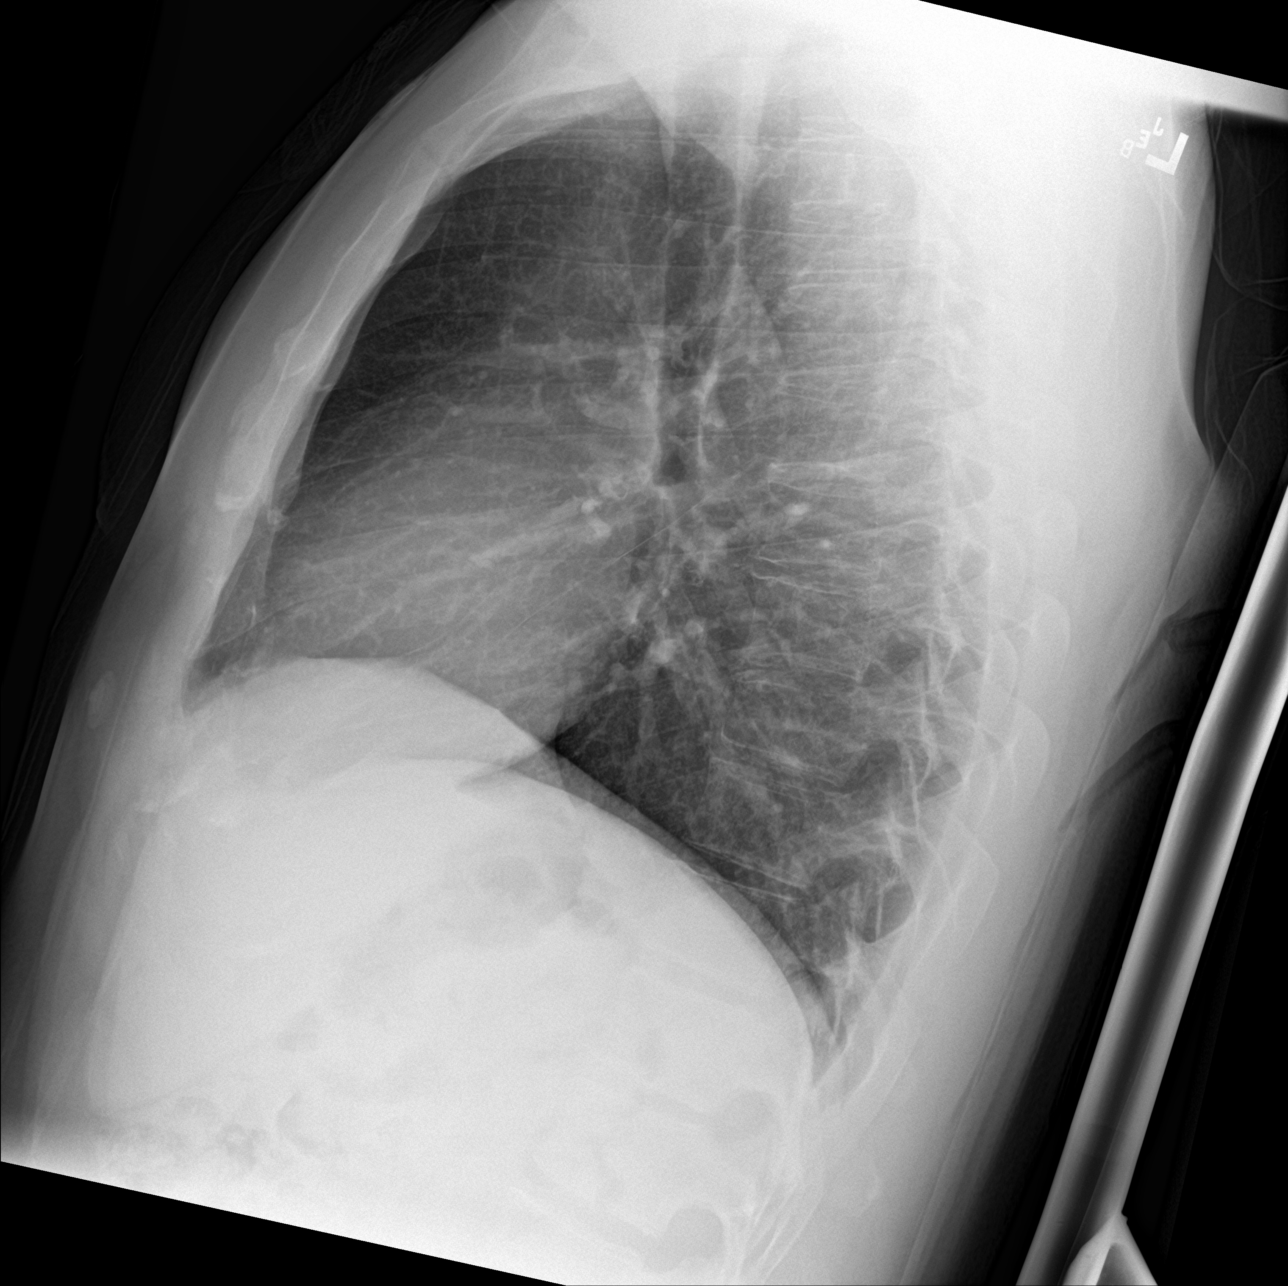

[chest ap (1 of 2)]
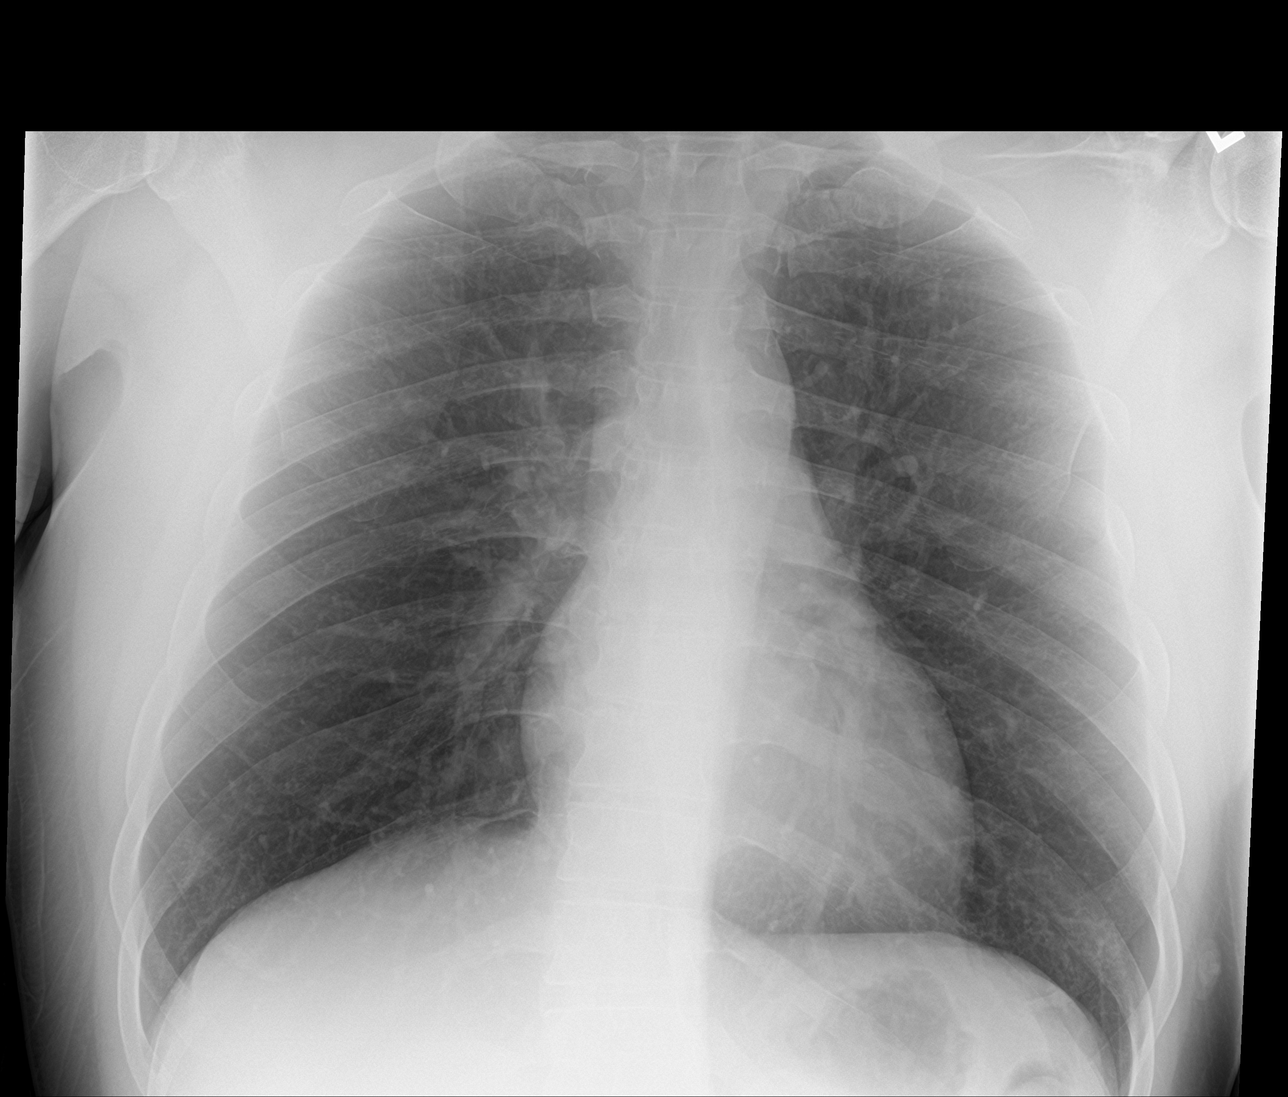

[chest ap (2 of 2)]
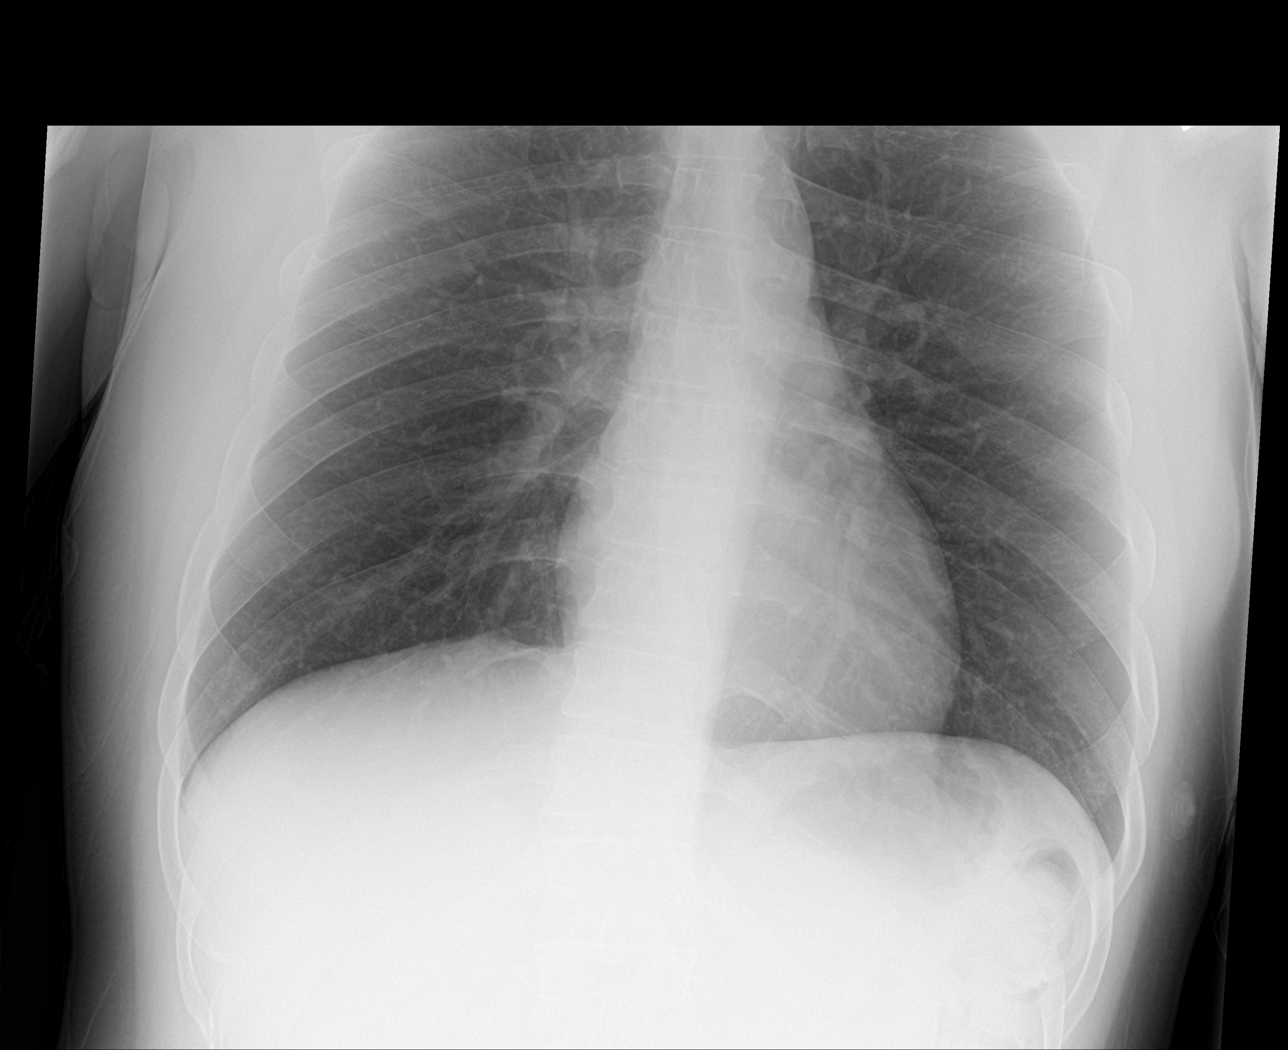

[3 of 3 positions shown; findings below may reference images not displayed]

FINDINGS: Cardiac and mediastinal contours are within normal limits. No focal
pulmonary opacity. No pleural effusion or pneumothorax. No acute
osseous abnormality.
IMPRESSION: No acute cardiopulmonary process.

## 2024-01-30 ENCOUNTER — Emergency Department (HOSPITAL_COMMUNITY)
Admission: EM | Admit: 2024-01-30 | Discharge: 2024-01-30 | Disposition: A | Discharge status: Home / Self Care | Attending: Emergency Medicine | Admitting: Emergency Medicine

## 2024-01-30 ENCOUNTER — Emergency Department (HOSPITAL_COMMUNITY)

## 2024-01-30 ENCOUNTER — Encounter (HOSPITAL_COMMUNITY): Payer: Self-pay

## 2024-01-30 ENCOUNTER — Other Ambulatory Visit: Payer: Self-pay

## 2024-01-30 DIAGNOSIS — M79605 Pain in left leg: Secondary | ICD-10-CM | POA: Insufficient documentation

## 2024-01-30 DIAGNOSIS — Y92410 Unspecified street and highway as the place of occurrence of the external cause: Secondary | ICD-10-CM | POA: Insufficient documentation

## 2024-01-30 DIAGNOSIS — M545 Low back pain, unspecified: Secondary | ICD-10-CM | POA: Insufficient documentation

## 2024-01-30 MED ORDER — ACETAMINOPHEN 325 MG PO TABS
650.0000 mg | ORAL_TABLET | Freq: Once | ORAL | Status: AC
  Administered 2024-01-30: 650 mg via ORAL
  Filled 2024-01-30: qty 2

## 2024-01-30 MED ORDER — PREDNISONE 20 MG PO TABS: 40.0000 mg | ORAL_TABLET | Freq: Every day | ORAL | 0 refills | Status: AC

## 2024-01-30 MED ORDER — TIZANIDINE HCL 4 MG PO TABS: 4.0000 mg | ORAL_TABLET | Freq: Three times a day (TID) | ORAL | 0 refills | Status: AC

## 2024-01-30 MED ORDER — SALONPAS 3.1-6-10 % EX PTCH: 1.0000 | MEDICATED_PATCH | Freq: Two times a day (BID) | CUTANEOUS | 0 refills | Status: AC

## 2024-01-30 MED ORDER — METHOCARBAMOL 500 MG PO TABS
500.0000 mg | ORAL_TABLET | Freq: Once | ORAL | Status: AC
  Administered 2024-01-30: 500 mg via ORAL
  Filled 2024-01-30: qty 1

## 2024-01-30 MED ORDER — LIDOCAINE 5 % EX PTCH
1.0000 | MEDICATED_PATCH | CUTANEOUS | Status: DC
  Administered 2024-01-30: 1 via TRANSDERMAL
  Filled 2024-01-30: qty 1

## 2024-01-30 MED ORDER — PREDNISONE 20 MG PO TABS
60.0000 mg | ORAL_TABLET | ORAL | Status: AC
  Administered 2024-01-30: 60 mg via ORAL
  Filled 2024-01-30: qty 3

## 2024-01-30 MED ORDER — TIZANIDINE HCL 4 MG PO TABS
2.0000 mg | ORAL_TABLET | Freq: Once | ORAL | Status: AC
  Administered 2024-01-30: 2 mg via ORAL
  Filled 2024-01-30: qty 1
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
